# Patient Record
Sex: Male | Born: 1995 | Race: White | Hispanic: No | Marital: Single | Smoking: Current every day smoker
Health system: Southern US, Community
[De-identification: ages and names within clinical notes are randomized; demographics above are authoritative.]

## PROBLEM LIST (undated history)

## (undated) DIAGNOSIS — F329 Major depressive disorder, single episode, unspecified: Secondary | ICD-10-CM

## (undated) DIAGNOSIS — S5420XA Injury of radial nerve at forearm level, unspecified arm, initial encounter: Secondary | ICD-10-CM

## (undated) DIAGNOSIS — S40811A Abrasion of right upper arm, initial encounter: Secondary | ICD-10-CM

## (undated) DIAGNOSIS — F419 Anxiety disorder, unspecified: Secondary | ICD-10-CM

## (undated) DIAGNOSIS — Z87898 Personal history of other specified conditions: Secondary | ICD-10-CM

## (undated) DIAGNOSIS — F32A Depression, unspecified: Secondary | ICD-10-CM

## (undated) DIAGNOSIS — L409 Psoriasis, unspecified: Secondary | ICD-10-CM

## (undated) HISTORY — PX: ORIF ANKLE FRACTURE: SHX5408

## (undated) HISTORY — DX: Major depressive disorder, single episode, unspecified: F32.9

## (undated) HISTORY — DX: Depression, unspecified: F32.A

## (undated) HISTORY — DX: Anxiety disorder, unspecified: F41.9

---
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
## 2018-04-25 ENCOUNTER — Encounter (HOSPITAL_COMMUNITY)
Admission: EM | Disposition: A | Discharge status: Home / Self Care | Payer: Self-pay | Source: Home / Self Care | Attending: Vascular Surgery

## 2018-04-25 ENCOUNTER — Emergency Department (HOSPITAL_COMMUNITY): Payer: Self-pay | Admitting: Certified Registered Nurse Anesthetist

## 2018-04-25 ENCOUNTER — Inpatient Hospital Stay (HOSPITAL_COMMUNITY)
Admission: EM | Admit: 2018-04-25 | Discharge: 2018-04-26 | DRG: 580 | Disposition: A | Discharge status: Home / Self Care | Payer: Self-pay | Attending: Vascular Surgery | Admitting: Vascular Surgery

## 2018-04-25 ENCOUNTER — Encounter (HOSPITAL_COMMUNITY): Payer: Self-pay | Admitting: Emergency Medicine

## 2018-04-25 DIAGNOSIS — S51812A Laceration without foreign body of left forearm, initial encounter: Principal | ICD-10-CM | POA: Diagnosis present

## 2018-04-25 DIAGNOSIS — Z791 Long term (current) use of non-steroidal anti-inflammatories (NSAID): Secondary | ICD-10-CM

## 2018-04-25 DIAGNOSIS — S55112A Laceration of radial artery at forearm level, left arm, initial encounter: Secondary | ICD-10-CM | POA: Diagnosis present

## 2018-04-25 DIAGNOSIS — S5420XA Injury of radial nerve at forearm level, unspecified arm, initial encounter: Secondary | ICD-10-CM | POA: Diagnosis present

## 2018-04-25 DIAGNOSIS — S55109A Unspecified injury of radial artery at forearm level, unspecified arm, initial encounter: Secondary | ICD-10-CM | POA: Diagnosis present

## 2018-04-25 DIAGNOSIS — S55192A Other specified injury of radial artery at forearm level, left arm, initial encounter: Secondary | ICD-10-CM

## 2018-04-25 DIAGNOSIS — R58 Hemorrhage, not elsewhere classified: Secondary | ICD-10-CM

## 2018-04-25 DIAGNOSIS — S61512A Laceration without foreign body of left wrist, initial encounter: Secondary | ICD-10-CM | POA: Diagnosis present

## 2018-04-25 DIAGNOSIS — R55 Syncope and collapse: Secondary | ICD-10-CM

## 2018-04-25 DIAGNOSIS — S40811A Abrasion of right upper arm, initial encounter: Secondary | ICD-10-CM

## 2018-04-25 DIAGNOSIS — W19XXXA Unspecified fall, initial encounter: Secondary | ICD-10-CM | POA: Diagnosis present

## 2018-04-25 DIAGNOSIS — Z87898 Personal history of other specified conditions: Secondary | ICD-10-CM

## 2018-04-25 HISTORY — DX: Personal history of other specified conditions: Z87.898

## 2018-04-25 HISTORY — PX: ARTERY REPAIR: SHX559

## 2018-04-25 HISTORY — PX: ARTERY REPAIR: SHX5117

## 2018-04-25 HISTORY — DX: Abrasion of right upper arm, initial encounter: S40.811A

## 2018-04-25 LAB — CBC WITH DIFFERENTIAL/PLATELET
Abs Immature Granulocytes: 0.05 10*3/uL (ref 0.00–0.07)
Basophils Absolute: 0.1 10*3/uL (ref 0.0–0.1)
Basophils Relative: 1 %
EOS ABS: 0.3 10*3/uL (ref 0.0–0.5)
EOS PCT: 3 %
HEMATOCRIT: 40.7 % (ref 39.0–52.0)
HEMOGLOBIN: 13.9 g/dL (ref 13.0–17.0)
Immature Granulocytes: 1 %
LYMPHS ABS: 3.9 10*3/uL (ref 0.7–4.0)
LYMPHS PCT: 38 %
MCH: 31.2 pg (ref 26.0–34.0)
MCHC: 34.2 g/dL (ref 30.0–36.0)
MCV: 91.5 fL (ref 80.0–100.0)
MONO ABS: 0.9 10*3/uL (ref 0.1–1.0)
Monocytes Relative: 8 %
Neutro Abs: 5.1 10*3/uL (ref 1.7–7.7)
Neutrophils Relative %: 49 %
Platelets: 239 10*3/uL (ref 150–400)
RBC: 4.45 MIL/uL (ref 4.22–5.81)
RDW: 12.1 % (ref 11.5–15.5)
WBC: 10.3 10*3/uL (ref 4.0–10.5)
nRBC: 0 % (ref 0.0–0.2)

## 2018-04-25 LAB — I-STAT TROPONIN, ED: Troponin i, poc: 0 ng/mL (ref 0.00–0.08)

## 2018-04-25 LAB — COMPREHENSIVE METABOLIC PANEL
ALBUMIN: 3.8 g/dL (ref 3.5–5.0)
ALK PHOS: 70 U/L (ref 38–126)
ALT: 30 U/L (ref 0–44)
AST: 20 U/L (ref 15–41)
Anion gap: 9 (ref 5–15)
BILIRUBIN TOTAL: 0.7 mg/dL (ref 0.3–1.2)
BUN: 17 mg/dL (ref 6–20)
CALCIUM: 8.9 mg/dL (ref 8.9–10.3)
CO2: 22 mmol/L (ref 22–32)
CREATININE: 1.12 mg/dL (ref 0.61–1.24)
Chloride: 107 mmol/L (ref 98–111)
GFR calc Af Amer: >60 mL/min (ref >60)
GFR calc non Af Amer: >60 mL/min (ref >60)
Glucose, Bld: 172 mg/dL — ABNORMAL HIGH (ref 70–99)
Potassium: 3.8 mmol/L (ref 3.5–5.1)
Sodium: 138 mmol/L (ref 135–145)
TOTAL PROTEIN: 6.4 g/dL — AB (ref 6.5–8.1)

## 2018-04-25 LAB — ABO/RH: ABO/RH(D): A POS

## 2018-04-25 LAB — TYPE AND SCREEN
ABO/RH(D): A POS
Antibody Screen: NEGATIVE

## 2018-04-25 LAB — POCT I-STAT 4, (NA,K, GLUC, HGB,HCT)
Glucose, Bld: 130 mg/dL — ABNORMAL HIGH (ref 70–99)
HCT: 30 % — ABNORMAL LOW (ref 39.0–52.0)
Hemoglobin: 10.2 g/dL — ABNORMAL LOW (ref 13.0–17.0)
Potassium: 3.7 mmol/L (ref 3.5–5.1)
Sodium: 143 mmol/L (ref 135–145)

## 2018-04-25 SURGERY — REPAIR, ARTERY, BRACHIAL
Anesthesia: General | Site: Wrist | Laterality: Left

## 2018-04-25 MED ORDER — SODIUM CHLORIDE 0.9 % IV SOLN
INTRAVENOUS | Status: DC | PRN
  Administered 2018-04-25: 25 ug/min via INTRAVENOUS

## 2018-04-25 MED ORDER — MEPERIDINE HCL 50 MG/ML IJ SOLN: 6.2500 mg | INTRAMUSCULAR | Status: DC | PRN

## 2018-04-25 MED ORDER — GLYCOPYRROLATE 0.2 MG/ML IJ SOLN
INTRAMUSCULAR | Status: DC | PRN
  Administered 2018-04-25: 0.1 mg via INTRAVENOUS

## 2018-04-25 MED ORDER — POTASSIUM CHLORIDE CRYS ER 20 MEQ PO TBCR
20.0000 meq | EXTENDED_RELEASE_TABLET | Freq: Once | ORAL | Status: AC
  Administered 2018-04-25: 20 meq via ORAL
  Filled 2018-04-25: qty 1

## 2018-04-25 MED ORDER — EPHEDRINE 5 MG/ML INJ
INTRAVENOUS | Status: AC
  Filled 2018-04-25: qty 10

## 2018-04-25 MED ORDER — PHENYLEPHRINE 40 MCG/ML (10ML) SYRINGE FOR IV PUSH (FOR BLOOD PRESSURE SUPPORT)
PREFILLED_SYRINGE | INTRAVENOUS | Status: AC
  Filled 2018-04-25: qty 10

## 2018-04-25 MED ORDER — CEFAZOLIN SODIUM-DEXTROSE 2-3 GM-%(50ML) IV SOLR
INTRAVENOUS | Status: DC | PRN
  Administered 2018-04-25: 2 g via INTRAVENOUS

## 2018-04-25 MED ORDER — FENTANYL CITRATE (PF) 250 MCG/5ML IJ SOLN
INTRAMUSCULAR | Status: AC
  Filled 2018-04-25: qty 5

## 2018-04-25 MED ORDER — ACETAMINOPHEN 325 MG PO TABS: 325.0000 mg | ORAL_TABLET | ORAL | Status: DC | PRN

## 2018-04-25 MED ORDER — BACITRACIN-NEOMYCIN-POLYMYXIN 400-5-5000 EX OINT
TOPICAL_OINTMENT | CUTANEOUS | Status: AC
  Filled 2018-04-25: qty 1

## 2018-04-25 MED ORDER — SUGAMMADEX SODIUM 200 MG/2ML IV SOLN
INTRAVENOUS | Status: DC | PRN
  Administered 2018-04-25: 200 mg via INTRAVENOUS

## 2018-04-25 MED ORDER — EPHEDRINE SULFATE 50 MG/ML IJ SOLN
INTRAMUSCULAR | Status: DC | PRN
  Administered 2018-04-25: 5 mg via INTRAVENOUS

## 2018-04-25 MED ORDER — GUAIFENESIN-DM 100-10 MG/5ML PO SYRP: 15.0000 mL | ORAL_SOLUTION | ORAL | Status: DC | PRN

## 2018-04-25 MED ORDER — ONDANSETRON HCL 4 MG/2ML IJ SOLN: 4.0000 mg | Freq: Four times a day (QID) | INTRAMUSCULAR | Status: DC | PRN

## 2018-04-25 MED ORDER — HYDROMORPHONE HCL 1 MG/ML IJ SOLN
1.0000 mg | Freq: Once | INTRAMUSCULAR | Status: AC
  Administered 2018-04-25: 1 mg via INTRAVENOUS
  Filled 2018-04-25: qty 1

## 2018-04-25 MED ORDER — GLYCOPYRROLATE PF 0.2 MG/ML IJ SOSY
PREFILLED_SYRINGE | INTRAMUSCULAR | Status: AC
  Filled 2018-04-25: qty 1

## 2018-04-25 MED ORDER — MIDAZOLAM HCL 2 MG/2ML IJ SOLN
INTRAMUSCULAR | Status: DC | PRN
  Administered 2018-04-25: 2 mg via INTRAVENOUS

## 2018-04-25 MED ORDER — HEMOSTATIC AGENTS (NO CHARGE) OPTIME
TOPICAL | Status: DC | PRN
  Administered 2018-04-25: 1 via TOPICAL

## 2018-04-25 MED ORDER — PHENOL 1.4 % MT LIQD: 1.0000 | OROMUCOSAL | Status: DC | PRN

## 2018-04-25 MED ORDER — MIDAZOLAM HCL 2 MG/2ML IJ SOLN: 0.5000 mg | Freq: Once | INTRAMUSCULAR | Status: DC | PRN

## 2018-04-25 MED ORDER — HEPARIN SODIUM (PORCINE) 1000 UNIT/ML IJ SOLN
INTRAMUSCULAR | Status: AC
  Filled 2018-04-25: qty 2

## 2018-04-25 MED ORDER — PANTOPRAZOLE SODIUM 40 MG PO TBEC
40.0000 mg | DELAYED_RELEASE_TABLET | Freq: Every day | ORAL | Status: DC
  Administered 2018-04-25 – 2018-04-26 (×2): 40 mg via ORAL
  Filled 2018-04-25 (×2): qty 1

## 2018-04-25 MED ORDER — ALBUMIN HUMAN 5 % IV SOLN
INTRAVENOUS | Status: DC | PRN
  Administered 2018-04-25: 13:00:00 via INTRAVENOUS

## 2018-04-25 MED ORDER — ONDANSETRON HCL 4 MG/2ML IJ SOLN
4.0000 mg | Freq: Once | INTRAMUSCULAR | Status: AC
  Administered 2018-04-25: 4 mg via INTRAVENOUS
  Filled 2018-04-25: qty 2

## 2018-04-25 MED ORDER — SODIUM CHLORIDE 0.9 % IV SOLN
INTRAVENOUS | Status: DC | PRN
  Administered 2018-04-25: 500 mL

## 2018-04-25 MED ORDER — LABETALOL HCL 5 MG/ML IV SOLN: 10.0000 mg | INTRAVENOUS | Status: DC | PRN

## 2018-04-25 MED ORDER — LIDOCAINE 2% (20 MG/ML) 5 ML SYRINGE
INTRAMUSCULAR | Status: AC
  Filled 2018-04-25: qty 5

## 2018-04-25 MED ORDER — ONDANSETRON HCL 4 MG/2ML IJ SOLN
INTRAMUSCULAR | Status: DC | PRN
  Administered 2018-04-25: 4 mg via INTRAVENOUS

## 2018-04-25 MED ORDER — ROCURONIUM BROMIDE 50 MG/5ML IV SOSY
PREFILLED_SYRINGE | INTRAVENOUS | Status: AC
  Filled 2018-04-25: qty 5

## 2018-04-25 MED ORDER — ALUM & MAG HYDROXIDE-SIMETH 200-200-20 MG/5ML PO SUSP: 15.0000 mL | ORAL | Status: DC | PRN

## 2018-04-25 MED ORDER — 0.9 % SODIUM CHLORIDE (POUR BTL) OPTIME
TOPICAL | Status: DC | PRN
  Administered 2018-04-25: 1000 mL

## 2018-04-25 MED ORDER — FENTANYL CITRATE (PF) 100 MCG/2ML IJ SOLN
25.0000 ug | INTRAMUSCULAR | Status: DC | PRN
  Administered 2018-04-25 (×3): 50 ug via INTRAVENOUS

## 2018-04-25 MED ORDER — PROMETHAZINE HCL 25 MG/ML IJ SOLN: 6.2500 mg | INTRAMUSCULAR | Status: DC | PRN

## 2018-04-25 MED ORDER — BACITRACIN-NEOMYCIN-POLYMYXIN OINTMENT TUBE
TOPICAL_OINTMENT | CUTANEOUS | Status: DC | PRN
  Administered 2018-04-25: 1 via TOPICAL

## 2018-04-25 MED ORDER — PROPOFOL 1000 MG/100ML IV EMUL
INTRAVENOUS | Status: AC
  Filled 2018-04-25: qty 100

## 2018-04-25 MED ORDER — ONDANSETRON HCL 4 MG/2ML IJ SOLN
INTRAMUSCULAR | Status: AC
  Filled 2018-04-25: qty 2

## 2018-04-25 MED ORDER — FENTANYL CITRATE (PF) 100 MCG/2ML IJ SOLN
INTRAMUSCULAR | Status: AC
  Filled 2018-04-25: qty 2

## 2018-04-25 MED ORDER — SODIUM CHLORIDE 0.9 % IV SOLN
INTRAVENOUS | Status: AC
  Filled 2018-04-25: qty 1.2

## 2018-04-25 MED ORDER — CEFAZOLIN SODIUM-DEXTROSE 2-4 GM/100ML-% IV SOLN
2.0000 g | Freq: Three times a day (TID) | INTRAVENOUS | Status: DC
  Administered 2018-04-25 – 2018-04-26 (×2): 2 g via INTRAVENOUS
  Filled 2018-04-25 (×3): qty 100

## 2018-04-25 MED ORDER — PROPOFOL 10 MG/ML IV BOLUS
INTRAVENOUS | Status: DC | PRN
  Administered 2018-04-25: 150 mg via INTRAVENOUS

## 2018-04-25 MED ORDER — ACETAMINOPHEN 325 MG RE SUPP: 325.0000 mg | RECTAL | Status: DC | PRN

## 2018-04-25 MED ORDER — MIDAZOLAM HCL 2 MG/2ML IJ SOLN
INTRAMUSCULAR | Status: AC
  Filled 2018-04-25: qty 2

## 2018-04-25 MED ORDER — SODIUM CHLORIDE 0.9 % IV SOLN
INTRAVENOUS | Status: DC | PRN
  Administered 2018-04-25 (×2): via INTRAVENOUS

## 2018-04-25 MED ORDER — LIDOCAINE 2% (20 MG/ML) 5 ML SYRINGE
INTRAMUSCULAR | Status: DC | PRN
  Administered 2018-04-25: 20 mg via INTRAVENOUS

## 2018-04-25 MED ORDER — SODIUM CHLORIDE 0.9 % IV BOLUS
1000.0000 mL | Freq: Once | INTRAVENOUS | Status: AC
  Administered 2018-04-25: 1000 mL via INTRAVENOUS

## 2018-04-25 MED ORDER — ROCURONIUM BROMIDE 50 MG/5ML IV SOSY
PREFILLED_SYRINGE | INTRAVENOUS | Status: DC | PRN
  Administered 2018-04-25: 30 mg via INTRAVENOUS

## 2018-04-25 MED ORDER — SENNOSIDES-DOCUSATE SODIUM 8.6-50 MG PO TABS: 1.0000 | ORAL_TABLET | Freq: Every evening | ORAL | Status: DC | PRN

## 2018-04-25 MED ORDER — PHENYLEPHRINE HCL 10 MG/ML IJ SOLN
INTRAMUSCULAR | Status: DC | PRN
  Administered 2018-04-25: 120 ug via INTRAVENOUS
  Administered 2018-04-25: 80 ug via INTRAVENOUS
  Administered 2018-04-25: 120 ug via INTRAVENOUS

## 2018-04-25 MED ORDER — OXYCODONE-ACETAMINOPHEN 5-325 MG PO TABS
1.0000 | ORAL_TABLET | ORAL | Status: DC | PRN
  Administered 2018-04-25 – 2018-04-26 (×5): 2 via ORAL
  Filled 2018-04-25 (×6): qty 2

## 2018-04-25 MED ORDER — PROPOFOL 10 MG/ML IV BOLUS
INTRAVENOUS | Status: AC
  Filled 2018-04-25: qty 20

## 2018-04-25 MED ORDER — FENTANYL CITRATE (PF) 250 MCG/5ML IJ SOLN
INTRAMUSCULAR | Status: DC | PRN
  Administered 2018-04-25 (×5): 50 ug via INTRAVENOUS

## 2018-04-25 MED ORDER — METOPROLOL TARTRATE 5 MG/5ML IV SOLN: 2.0000 mg | INTRAVENOUS | Status: DC | PRN

## 2018-04-25 MED ORDER — BISACODYL 10 MG RE SUPP: 10.0000 mg | Freq: Every day | RECTAL | Status: DC | PRN

## 2018-04-25 MED ORDER — DEXAMETHASONE SODIUM PHOSPHATE 10 MG/ML IJ SOLN
INTRAMUSCULAR | Status: DC | PRN
  Administered 2018-04-25: 10 mg via INTRAVENOUS

## 2018-04-25 MED ORDER — SUCCINYLCHOLINE CHLORIDE 20 MG/ML IJ SOLN
INTRAMUSCULAR | Status: DC | PRN
  Administered 2018-04-25: 140 mg via INTRAVENOUS

## 2018-04-25 MED ORDER — HYDRALAZINE HCL 20 MG/ML IJ SOLN: 5.0000 mg | INTRAMUSCULAR | Status: DC | PRN

## 2018-04-25 MED ORDER — MORPHINE SULFATE (PF) 2 MG/ML IV SOLN
2.0000 mg | INTRAVENOUS | Status: DC | PRN
  Administered 2018-04-25 – 2018-04-26 (×6): 2 mg via INTRAVENOUS
  Filled 2018-04-25 (×6): qty 1

## 2018-04-25 MED ORDER — SUCCINYLCHOLINE CHLORIDE 200 MG/10ML IV SOSY
PREFILLED_SYRINGE | INTRAVENOUS | Status: AC
  Filled 2018-04-25: qty 10

## 2018-04-25 SURGICAL SUPPLY — 31 items
ARMBAND PINK RESTRICT EXTREMIT (MISCELLANEOUS) IMPLANT
CANISTER SUCT 3000ML PPV (MISCELLANEOUS) ×3 IMPLANT
CATH EMB 2FR 60CM (CATHETERS) ×3 IMPLANT
CLIP VESOCCLUDE MED 6/CT (CLIP) ×3 IMPLANT
CLIP VESOCCLUDE SM WIDE 6/CT (CLIP) ×3 IMPLANT
COVER PROBE W GEL 5X96 (DRAPES) ×3 IMPLANT
CUFF TOURNIQUET SINGLE 18IN (TOURNIQUET CUFF) ×3 IMPLANT
DERMABOND ADVANCED (GAUZE/BANDAGES/DRESSINGS) ×2
DERMABOND ADVANCED .7 DNX12 (GAUZE/BANDAGES/DRESSINGS) ×1 IMPLANT
DRSG MEPILEX BORDER 4X4 (GAUZE/BANDAGES/DRESSINGS) ×3 IMPLANT
ELECT REM PT RETURN 9FT ADLT (ELECTROSURGICAL) ×3
ELECTRODE REM PT RTRN 9FT ADLT (ELECTROSURGICAL) ×1 IMPLANT
GLOVE BIO SURGEON STRL SZ7.5 (GLOVE) ×3 IMPLANT
GOWN STRL REUS W/ TWL LRG LVL3 (GOWN DISPOSABLE) ×2 IMPLANT
GOWN STRL REUS W/ TWL XL LVL3 (GOWN DISPOSABLE) ×1 IMPLANT
GOWN STRL REUS W/TWL LRG LVL3 (GOWN DISPOSABLE) ×4
GOWN STRL REUS W/TWL XL LVL3 (GOWN DISPOSABLE) ×2
KIT BASIN OR (CUSTOM PROCEDURE TRAY) ×3 IMPLANT
KIT TURNOVER KIT B (KITS) ×3 IMPLANT
NS IRRIG 1000ML POUR BTL (IV SOLUTION) ×3 IMPLANT
PACK CV ACCESS (CUSTOM PROCEDURE TRAY) ×3 IMPLANT
PAD ARMBOARD 7.5X6 YLW CONV (MISCELLANEOUS) ×6 IMPLANT
STOPCOCK 4 WAY LG BORE MALE ST (IV SETS) ×3 IMPLANT
SURGICEL SNOW 2X4 (HEMOSTASIS) ×3 IMPLANT
SUT MNCRL AB 4-0 PS2 18 (SUTURE) ×3 IMPLANT
SUT PROLENE 6 0 BV (SUTURE) ×6 IMPLANT
SUT VIC AB 3-0 SH 27 (SUTURE) ×2
SUT VIC AB 3-0 SH 27X BRD (SUTURE) ×1 IMPLANT
TOWEL GREEN STERILE (TOWEL DISPOSABLE) ×3 IMPLANT
UNDERPAD 30X30 (UNDERPADS AND DIAPERS) ×3 IMPLANT
WATER STERILE IRR 1000ML POUR (IV SOLUTION) ×3 IMPLANT

## 2018-04-25 NOTE — ED Triage Notes (Signed)
Per EMS pt has recent history of seizures.  Today he was in the kitchen and had a possible syncopal event and fell on a plate and has large laceration on left posterior wrist.  Police applied tourniquet at 1005.  EMS does not know if is arterial.  Pt AOx4

## 2018-04-25 NOTE — H&P (Signed)
Hospital Consult    Reason for Consult: Bleeding left arm Referring Physician: Dr. Rush Landmarkegeler (ED) MRN #:  [id number]  History of Present Illness: This is a 22 y.o. male presented after syncopal episode with associated fall and injury to left wrist with plate glass window.  Does not take any blood thinners.  Tourniquet has been in place for 1 hour.  Patient remembers tetanus shot 2 years ago with ankle injury.  Past Medical History:  Diagnosis Date  . Anxiety   . Depression   . Headache(784.0)   . Syncope and collapse     Past Surgical History:  Procedure Laterality Date  . CIRCUMCISION  1999    No Known Allergies  Prior to Admission medications   Medication Sig Start Date End Date Taking? Authorizing Provider  ibuprofen (ADVIL,MOTRIN) 600 MG tablet Take 1 tablet (600 mg total) by mouth every 8 (eight) hours as needed. 02/01/16   Azalia Bilisampos, Kevin, MD    Social History   Socioeconomic History  . Marital status: Single    Spouse name: Not on file  . Number of children: Not on file  . Years of education: 7  . Highest education level: Not on file  Occupational History    Employer: UNEMPLOYED  Social Needs  . Financial resource strain: Not on file  . Food insecurity:    Worry: Not on file    Inability: Not on file  . Transportation needs:    Medical: Not on file    Non-medical: Not on file  Tobacco Use  . Smoking status: Never Smoker  . Smokeless tobacco: Never Used  Substance and Sexual Activity  . Alcohol use: No  . Drug use: No  . Sexual activity: Never  Lifestyle  . Physical activity:    Days per week: Not on file    Minutes per session: Not on file  . Stress: Not on file  Relationships  . Social connections:    Talks on phone: Not on file    Gets together: Not on file    Attends religious service: Not on file    Active member of club or organization: Not on file    Attends meetings of clubs or organizations: Not on file    Relationship status: Not on file    . Intimate partner violence:    Fear of current or ex partner: Not on file    Emotionally abused: Not on file    Physically abused: Not on file    Forced sexual activity: Not on file  Other Topics Concern  . Not on file  Social History Narrative  . Not on file    Family History  Problem Relation Age of Onset  . Sudden death Paternal Grandfather        At age 20  . Headache Father   . Sudden death Other        At age 22  . Sudden death Paternal Uncle        At age 22    ROS: Right arm pain  Physical Examination  There were no vitals filed for this visit. Body mass index is 20.54 kg/m.  General:  WDWN in NAD Gait: Not observed HENT: WNL, normocephalic Extremities there is a 3 cm laceration to the volar left wrist with exposed muscle Musculoskeletal: no muscle wasting or atrophy  tourniquet is in place above the left elbow Neurologic: He cannot make a grip of the left hand   CBC    Component Value  Date/Time   WBC 6.0 02/13/2015 1852   RBC 4.52 02/13/2015 1852   HGB 14.1 02/13/2015 1852   HCT 41.1 02/13/2015 1852   PLT 179 02/13/2015 1852   MCV 90.9 02/13/2015 1852   MCH 31.2 02/13/2015 1852   MCHC 34.3 02/13/2015 1852   RDW 12.4 02/13/2015 1852    BMET    Component Value Date/Time   NA 139 02/13/2015 1852   K 4.0 02/13/2015 1852   CL 106 02/13/2015 1852   CO2 26 02/13/2015 1852   GLUCOSE 96 02/13/2015 1852   BUN 17 02/13/2015 1852   CREATININE 1.16 (H) 02/13/2015 1852   CALCIUM 9.2 02/13/2015 1852   GFRNONAA NOT CALCULATED 02/13/2015 1852   GFRAA NOT CALCULATED 02/13/2015 1852    COAGS: No results found for: INR, PROTIME   Non-Invasive Vascular Imaging:   No imaging   ASSESSMENT/PLAN: This is a 22 y.o. male with plate glass injury to left wrist with likely arterial and venous injury in the area of the left radial neurovascular bundle.  We will plan to take the operating room urgently for exploration and possible vascular repair.  I discussed  risk and benefits with him and possible need for hand surgery involvement.  I have also discussed with his mother and sister via telephone who are on their way.  Shaheen Star C. Randie Heinzain, MD [facility] [place] Office: [phone] Pager: [phone]

## 2018-04-25 NOTE — Anesthesia Procedure Notes (Signed)
Procedure Name: Intubation Date/Time: 04/25/2018 11:56 AM Performed by: Inda Coke, CRNA Pre-anesthesia Checklist: Patient identified, Emergency Drugs available, Suction available and Patient being monitored Patient Re-evaluated:Patient Re-evaluated prior to induction Oxygen Delivery Method: Circle System Utilized Preoxygenation: Pre-oxygenation with 100% oxygen Induction Type: IV induction, Rapid sequence and Cricoid Pressure applied Ventilation: Mask ventilation without difficulty Laryngoscope Size: Mac and 4 Grade View: Grade I Tube type: Oral Tube size: 7.5 mm Number of attempts: 1 Airway Equipment and Method: Stylet and Oral airway Placement Confirmation: ETT inserted through vocal cords under direct vision,  positive ETCO2 and breath sounds checked- equal and bilateral Secured at: 22 cm Tube secured with: Tape Dental Injury: Teeth and Oropharynx as per pre-operative assessment

## 2018-04-25 NOTE — Progress Notes (Signed)
Patient received from PACU, A&Ox4, VSS. Telemetry applied and CCMD notified. 

## 2018-04-25 NOTE — Consult Note (Signed)
ORTHOPAEDIC CONSULTATION HISTORY & PHYSICAL REQUESTING PHYSICIAN: Juventino SlovakCain, Brandon Christophe*  Chief Complaint: left wrist injury  HPI: Moreen Fowlerracy S Wieland is a 22 y.o. male who fell onto a plate, laceration the radial aspect of his left distal forearm.  Concern existed for arterial injury, and he was transported via EMS to the emergency department, taken to the operating room by Dr. Pascal LuxKane, and his radial artery repaired.  In the course of exploration, Dr. Randie Heinzain observed what appeared to be a cutaneous nerve lacerated, and I was consulted intraoperatively.  I was unavailable to observe the wound at that time, but have since evaluated the patient in the PACU.  He is awake, conversant, and does fairly heavy work with his hands.  He has his family's primary income producer, with a baby at home.  Past Medical History:  Diagnosis Date  . Anxiety   . Depression   . Headache(784.0)   . Syncope and collapse    Past Surgical History:  Procedure Laterality Date  . CIRCUMCISION  1999   Social History   Socioeconomic History  . Marital status: Single    Spouse name: Not on file  . Number of children: Not on file  . Years of education: 7  . Highest education level: Not on file  Occupational History    Employer: UNEMPLOYED  Social Needs  . Financial resource strain: Not on file  . Food insecurity:    Worry: Not on file    Inability: Not on file  . Transportation needs:    Medical: Not on file    Non-medical: Not on file  Tobacco Use  . Smoking status: Never Smoker  . Smokeless tobacco: Never Used  Substance and Sexual Activity  . Alcohol use: No  . Drug use: No  . Sexual activity: Never  Lifestyle  . Physical activity:    Days per week: Not on file    Minutes per session: Not on file  . Stress: Not on file  Relationships  . Social connections:    Talks on phone: Not on file    Gets together: Not on file    Attends religious service: Not on file    Active member of club or  organization: Not on file    Attends meetings of clubs or organizations: Not on file    Relationship status: Not on file  Other Topics Concern  . Not on file  Social History Narrative  . Not on file   Family History  Problem Relation Age of Onset  . Sudden death Paternal Grandfather        At age 20  . Headache Father   . Sudden death Other        At age 20  . Sudden death Paternal Uncle        At age 20   No Known Allergies Prior to Admission medications   Medication Sig Start Date End Date Taking? Authorizing Provider  ibuprofen (ADVIL,MOTRIN) 600 MG tablet Take 1 tablet (600 mg total) by mouth every 8 (eight) hours as needed. 02/01/16   Azalia Bilisampos, Kevin, MD   No results found.  Positive ROS: All other systems have been reviewed and were otherwise negative with the exception of those mentioned in the HPI and as above.  Physical Exam: Vitals: Refer to EMR. Constitutional:  WD, WN, NAD HEENT:  NCAT, EOMI Neuro/Psych:  Alert & oriented to person, place, and time; appropriate mood & affect Lymphatic: No generalized extremity edema or lymphadenopathy Extremities / MSK:  The extremities are normal with respect to appearance, ranges of motion, joint stability, muscle strength/tone, sensation, & perfusion except as otherwise noted:  There is a Mepilex dressing on the radial aspect of the left distal forearm.  With percussing of the dressing, there is a Tinel sign, and there is markedly altered sensibility in the region of the superficial radial nerve.  Digital motion is full.  Assessment: Likely left superficial radial nerve laceration  Plan: I discussed these findings with him and his mother at the bedside.  I reviewed what he might expect both with and without operative exploration and repair.  After careful consideration and deliberation, he indicated he would like to proceed with surgical exploration and likely repair.  This is tentatively planned for Monday, 05-01-18, at [facility].  Goals, risk, and options were reviewed and consent obtained.  I have confirmed with the staff at [facility] that the case has indeed been posted and placed officially on the schedule.  Maija Biggers A. Greg Eckrich, MD      Orthopaedic & Hand Surgery [facility] [address] , [place]  [address] Office: [phone] Mobile: [phone]  04/25/2018, 3:28 PM  

## 2018-04-25 NOTE — Anesthesia Postprocedure Evaluation (Signed)
Anesthesia Post Note  Patient: Antonio Lewis  Procedure(s) Performed: LEFT WRIST EXPLORATION (Left Wrist) RADIAL ARTERY REPAIR (Left Wrist)     Patient location during evaluation: PACU Anesthesia Type: General Level of consciousness: awake and alert, patient cooperative and oriented Pain management: pain level controlled Vital Signs Assessment: post-procedure vital signs reviewed and stable Respiratory status: spontaneous breathing, nonlabored ventilation and respiratory function stable Cardiovascular status: blood pressure returned to baseline and stable Postop Assessment: no apparent nausea or vomiting Anesthetic complications: no    Last Vitals:  Vitals:   04/25/18 1130 04/25/18 1315  BP: (!) 142/62   Pulse: (!) 56   Resp: 14   Temp:  36.7 C  SpO2: 100%     Last Pain:  Vitals:   04/25/18 1355  TempSrc:   PainSc: Asleep                 Charlie Seda,E. Ai Sonnenfeld

## 2018-04-25 NOTE — H&P (View-Only) (Signed)
ORTHOPAEDIC CONSULTATION HISTORY & PHYSICAL REQUESTING PHYSICIAN: Juventino SlovakCain, Brandon Christophe*  Chief Complaint: left wrist injury  HPI: Antonio Lewis is a 22 y.o. male who fell onto a plate, laceration the radial aspect of his left distal forearm.  Concern existed for arterial injury, and he was transported via EMS to the emergency department, taken to the operating room by Dr. Pascal LuxKane, and his radial artery repaired.  In the course of exploration, Dr. Randie Heinzain observed what appeared to be a cutaneous nerve lacerated, and I was consulted intraoperatively.  I was unavailable to observe the wound at that time, but have since evaluated the patient in the PACU.  He is awake, conversant, and does fairly heavy work with his hands.  He has his family's primary income producer, with a baby at home.  Past Medical History:  Diagnosis Date  . Anxiety   . Depression   . Headache(784.0)   . Syncope and collapse    Past Surgical History:  Procedure Laterality Date  . CIRCUMCISION  1999   Social History   Socioeconomic History  . Marital status: Single    Spouse name: Not on file  . Number of children: Not on file  . Years of education: 7  . Highest education level: Not on file  Occupational History    Employer: UNEMPLOYED  Social Needs  . Financial resource strain: Not on file  . Food insecurity:    Worry: Not on file    Inability: Not on file  . Transportation needs:    Medical: Not on file    Non-medical: Not on file  Tobacco Use  . Smoking status: Never Smoker  . Smokeless tobacco: Never Used  Substance and Sexual Activity  . Alcohol use: No  . Drug use: No  . Sexual activity: Never  Lifestyle  . Physical activity:    Days per week: Not on file    Minutes per session: Not on file  . Stress: Not on file  Relationships  . Social connections:    Talks on phone: Not on file    Gets together: Not on file    Attends religious service: Not on file    Active member of club or  organization: Not on file    Attends meetings of clubs or organizations: Not on file    Relationship status: Not on file  Other Topics Concern  . Not on file  Social History Narrative  . Not on file   Family History  Problem Relation Age of Onset  . Sudden death Paternal Grandfather        At age 20  . Headache Father   . Sudden death Other        At age 20  . Sudden death Paternal Uncle        At age 20   No Known Allergies Prior to Admission medications   Medication Sig Start Date End Date Taking? Authorizing Provider  ibuprofen (ADVIL,MOTRIN) 600 MG tablet Take 1 tablet (600 mg total) by mouth every 8 (eight) hours as needed. 02/01/16   Azalia Bilisampos, Kevin, MD   No results found.  Positive ROS: All other systems have been reviewed and were otherwise negative with the exception of those mentioned in the HPI and as above.  Physical Exam: Vitals: Refer to EMR. Constitutional:  WD, WN, NAD HEENT:  NCAT, EOMI Neuro/Psych:  Alert & oriented to person, place, and time; appropriate mood & affect Lymphatic: No generalized extremity edema or lymphadenopathy Extremities / MSK:  The extremities are normal with respect to appearance, ranges of motion, joint stability, muscle strength/tone, sensation, & perfusion except as otherwise noted:  There is a Mepilex dressing on the radial aspect of the left distal forearm.  With percussing of the dressing, there is a Tinel sign, and there is markedly altered sensibility in the region of the superficial radial nerve.  Digital motion is full.  Assessment: Likely left superficial radial nerve laceration  Plan: I discussed these findings with him and his mother at the bedside.  I reviewed what he might expect both with and without operative exploration and repair.  After careful consideration and deliberation, he indicated he would like to proceed with surgical exploration and likely repair.  This is tentatively planned for [facility], 05[facility]01-18, [facility].  Goals, risk, and options were reviewed and consent obtained.  I have confirmed with the staff at [facility] that the case has indeed been posted and placed officially on the schedule.  Cliffton Astersavid A. Janee Mornhompson, MD      Orthopaedic & Hand Surgery [facility] [address] [place], [place]  [id number] Office: [phone] Mobile: [phone]  04/25/2018, 3:28 PM

## 2018-04-25 NOTE — Progress Notes (Signed)
Necklace placed in denture cup and taken to PACU

## 2018-04-25 NOTE — Transfer of Care (Signed)
Immediate Anesthesia Transfer of Care Note  Patient: Antonio Lewis  Procedure(s) Performed: LEFT WRIST EXPLORATION (Left Wrist) RADIAL ARTERY REPAIR (Left Wrist)  Patient Location: PACU  Anesthesia Type:General  Level of Consciousness: awake, patient cooperative and responds to stimulation  Airway & Oxygen Therapy: Patient Spontanous Breathing and Patient connected to nasal cannula oxygen  Post-op Assessment: Report given to RN and Post -op Vital signs reviewed and stable  Post vital signs: Reviewed and stable  Last Vitals:  Vitals Value Taken Time  BP 93/69 04/25/2018  1:12 PM  Temp    Pulse 86 04/25/2018  1:19 PM  Resp 22 04/25/2018  1:19 PM  SpO2 100 % 04/25/2018  1:19 PM  Vitals shown include unvalidated device data.  Last Pain:  Vitals:   04/25/18 1117  TempSrc: Oral  PainSc:          Complications: No apparent anesthesia complications

## 2018-04-25 NOTE — Anesthesia Preprocedure Evaluation (Addendum)
Anesthesia Evaluation  Patient identified by MRN, date of birth, ID band Patient awake    Reviewed: Allergy & Precautions, NPO status , Patient's Chart, lab work & pertinent test results  History of Anesthesia Complications Negative for: history of anesthetic complications  Airway Mallampati: II  TM Distance: >3 FB Neck ROM: Full    Dental  (+) Dental Advisory Given   Pulmonary neg pulmonary ROS,    breath sounds clear to auscultation       Cardiovascular negative cardio ROS   Rhythm:Regular Rate:Normal     Neuro/Psych  Headaches, Seizures -,  Anxiety Depression    GI/Hepatic negative GI ROS, Neg liver ROS,   Endo/Other  negative endocrine ROS  Renal/GU negative Renal ROS     Musculoskeletal   Abdominal   Peds  Hematology negative hematology ROS (+)   Anesthesia Other Findings Possible seizure today, fell and lacerated wrist  Reproductive/Obstetrics                            Anesthesia Physical Anesthesia Plan  ASA: II and emergent  Anesthesia Plan: General   Post-op Pain Management:    Induction: Intravenous and Rapid sequence  PONV Risk Score and Plan: 2 and Ondansetron and Dexamethasone  Airway Management Planned: Oral ETT  Additional Equipment:   Intra-op Plan:   Post-operative Plan: Extubation in OR  Informed Consent: I have reviewed the patients History and Physical, chart, labs and discussed the procedure including the risks, benefits and alternatives for the proposed anesthesia with the patient or authorized representative who has indicated his/her understanding and acceptance.   Dental advisory given  Plan Discussed with: CRNA and Surgeon  Anesthesia Plan Comments: (Plan routine monitors, GETA)       Anesthesia Quick Evaluation

## 2018-04-25 NOTE — Op Note (Signed)
    Patient name: Antonio Lewis MRN: [id number] DOB: [birth year] Sex: male  04/25/2018 Pre-operative Diagnosis: Injury left radial artery  post-operative diagnosis:  Same Surgeon:  Apolinar JunesBrandon C. Randie Heinzain, MD Assistant: Doreatha MassedSamantha Rhyne, PA Procedure Performed: 1.  Thrombectomy left radial artery 2.  Primary repair left radial artery transection  Indications: 22 year old male sustained a laceration to his left wrist which had pulsatile bleeding and tourniquet was placed 1 hour prior to arrival.  He was indicated for urgent exploration of the wound with possible arterial and venous repair.  No preoperative neuro study could be obtained given pain secondary to tourniquet placement.  Findings: The radial artery itself was 50% transected and was primarily repaired.  There was acute thrombus distally that was removed with embolectomy.  At completion there was a palpable radial pulse distally to this.  There was a strong ulnar pulse after the tourniquet was taken down and there was a palmar arch signal at completion.   Procedure:  The patient was identified in the holding area and taken t the operating room was placed supine operative table general anesthesia was induced.  He was sterilely prepped and draped in left upper extremity left groin for possible saphenous vein harvest antibiotics were administered timeout was called.  We began with exploring the wound were identified 2 ends of the radial artery and clamped these.  Tourniquet was then allowed down.  There was some bleeding from concomitant vein this was tied off with silk suture.  Identified a transected nerve and consulted hand surgery they will evaluate later.  There was good antegrade bleeding but there was no backbleeding and so thrombectomy was performed there was some acute clot removed and backbleeding then occurred.  The artery was reclamped.  The artery was mobilized a few cm in either direction and then primarily repaired with running 6-0 Prolene  suture.  We then had a good signal at the palmar arch ulnar artery and then later in the case there was a palpable radial artery.  I discussed the case with the on-call hand surgeon he will evaluate the patient later.  We irrigated the wound copiously and closed the skin with interrupted 3-0 nylon suture and a sterile dressing was placed.  He tolerated this procedure without immediate complication.  EBL: 50 cc   [name], MD [facility] [place] Office: [phone] Pager: [phone]

## 2018-04-25 NOTE — ED Provider Notes (Signed)
[facility] EMERGENCY DEPARTMENT Provider Note   CSN: [id number] Arrival date & time: 04/25/18  1051     History   Chief Complaint Chief Complaint  Patient presents with  . Extremity Laceration    HPI Antonio Lewis is a 22 y.o. male.  The history is provided by the patient, the EMS personnel and medical records. No language interpreter was used.  Laceration   The incident occurred 1 to 2 hours ago. The laceration is located on the left arm. The laceration is 3 cm in size. Injury mechanism: broken ceramic plate. The pain is at a severity of 10/10. The pain is severe. The pain has been constant since onset. His tetanus status is unknown.  Loss of Consciousness   This is a recurrent problem. The current episode started 1 to 2 hours ago. The problem occurs rarely. The problem has been resolved. There was no loss of consciousness. Associated symptoms include light-headedness (near syncopal episiode) and weakness. Pertinent negatives include abdominal pain, back pain, chest pain, confusion, congestion, diaphoresis, fever, malaise/fatigue, nausea, palpitations and vomiting. He has tried nothing for the symptoms.    Past Medical History:  Diagnosis Date  . Anxiety   . Depression   . Headache(784.0)   . Syncope and collapse     Patient Active Problem List   Diagnosis Date Noted  . 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 Dizziness and giddiness 08/09/2012  . Depression with anxiety 08/09/2012  . Knee pain 04/14/2011  . Anterior knee pain 03/30/2011    Past Surgical History:  Procedure Laterality Date  . CIRCUMCISION  1999        Home Medications    Prior to Admission medications   Medication Sig Start Date End Date Taking? Authorizing Provider  ibuprofen (ADVIL,MOTRIN) 600 MG tablet Take 1 tablet (600 mg total) by mouth every 8 (eight) hours as needed. 02/01/16   Azalia Bilis, MD    Family History Family History  Problem  Relation Age of Onset  . Sudden death Paternal Grandfather        At age 6  . Headache Father   . Sudden death Other        At age 29  . Sudden death Paternal Uncle        At age 23    Social History Social History   Tobacco Use  . Smoking status: Never Smoker  . Smokeless tobacco: Never Used  Substance Use Topics  . Alcohol use: No  . Drug use: No     Allergies   Patient has no known allergies.   Review of Systems Review of Systems  Constitutional: Negative for chills, diaphoresis, fatigue, fever and malaise/fatigue.  HENT: Negative for congestion.   Eyes: Negative for visual disturbance.  Respiratory: Negative for cough, chest tightness, shortness of breath and wheezing.   Cardiovascular: Positive for syncope. Negative for chest pain, palpitations and leg swelling.  Gastrointestinal: Negative for abdominal pain, constipation, diarrhea, nausea and vomiting.  Genitourinary: Negative for flank pain.  Musculoskeletal: Negative for back pain, neck pain and neck stiffness.  Skin: Positive for wound. Negative for rash.  Neurological: Positive for weakness, light-headedness (near syncopal episiode) and numbness. Negative for syncope (near syncopal) and facial asymmetry.  Psychiatric/Behavioral: Negative for agitation and confusion.  All other systems reviewed and are negative.    Physical Exam Updated Vital Signs BP (!) 125/48   Pulse (!) 56   Temp 98.1 F (36.7 C)  Resp 14   Ht 6\' 2"  (1.88 m)   Wt 72.6 kg   SpO2 99%   BMI 20.54 kg/m   Physical Exam Vitals signs and nursing note reviewed.  Constitutional:      General: He is not in acute distress.    Appearance: He is well-developed. He is not ill-appearing, toxic-appearing or diaphoretic.  HENT:     Head: Normocephalic and atraumatic.  Eyes:     Conjunctiva/sclera: Conjunctivae normal.  Neck:     Musculoskeletal: Neck supple.  Cardiovascular:     Rate and Rhythm: Normal rate and regular rhythm.      Heart sounds: No murmur.  Pulmonary:     Effort: Pulmonary effort is normal. No respiratory distress.     Breath sounds: Normal breath sounds. No wheezing or rhonchi.  Chest:     Chest wall: No tenderness.  Abdominal:     Palpations: Abdomen is soft.     Tenderness: There is no abdominal tenderness. There is no guarding.  Musculoskeletal:        General: Tenderness and signs of injury present.     Left forearm: He exhibits laceration.       Arms:     Comments: After tourniquet removal, patient has pulsatile bleeding of the left forearm laceration.  Patient has decreased sensation in the left hand.  Decreased grip strength due to pain.  No other injury seen.  Skin:    Capillary Refill: Capillary refill takes less than 2 seconds.     Coloration: Skin is pale.  Neurological:     Mental Status: He is alert.     Sensory: Sensory deficit present.     Motor: Weakness present.  Psychiatric:        Mood and Affect: Mood normal.      ED Treatments / Results  Labs (all labs ordered are listed, but only abnormal results are displayed) Labs Reviewed  COMPREHENSIVE METABOLIC PANEL - Abnormal; Notable for the following components:      Result Value   Glucose, Bld 172 (*)    Total Protein 6.4 (*)    All other components within normal limits  POCT I-STAT 4, (NA,K, GLUC, HGB,HCT) - Abnormal; Notable for the following components:   Glucose, Bld 130 (*)    HCT 30.0 (*)    Hemoglobin 10.2 (*)    All other components within normal limits  CBC WITH DIFFERENTIAL/PLATELET  I-STAT TROPONIN, ED  TYPE AND SCREEN  ABO/RH    EKG EKG Interpretation  Date/Time:  Tuesday April 25 2018 11:17:58 EST Ventricular Rate:  51 PR Interval:    QRS Duration: 109 QT Interval:  445 QTC Calculation: 410 R Axis:   66 Text Interpretation:  Sinus rhythm RSR' in V1 or V2, probably normal variant ST elevation suggests acute pericarditis when compared to prior, no significant changes from prior.  No STEMI  Confirmed by Theda Belfastegeler, Chris ([id number]) on 04/25/2018 11:24:35 AM   Radiology No results found.  Procedures Procedures (including critical care time)  CRITICAL CARE Performed by: Canary Brimhristopher J Tegeler Total critical care time: 35 minutes Critical care time was exclusive of separately billable procedures and treating other patients. Laceration with arterial bleeding using a tourniquet, went straight to operating room with vascular surgery, threatened to limb. Critical care was necessary to treat or prevent imminent or life-threatening deterioration. Critical care was time spent personally by me on the following activities: development of treatment plan with patient and/or surrogate as well as nursing, discussions with consultants,  evaluation of patient's response to treatment, examination of patient, obtaining history from patient or surrogate, ordering and performing treatments and interventions, ordering and review of laboratory studies, ordering and review of radiographic studies, pulse oximetry and re-evaluation of patient's condition.   Medications Ordered in ED Medications  meperidine (DEMEROL) injection 6.25-12.5 mg (has no administration in time range)  midazolam (VERSED) injection 0.5-2 mg (has no administration in time range)  promethazine (PHENERGAN) injection 6.25-12.5 mg (has no administration in time range)  fentaNYL (SUBLIMAZE) injection 25-50 mcg (50 mcg Intravenous Given 04/25/18 1513)  fentaNYL (SUBLIMAZE) 100 MCG/2ML injection (has no administration in time range)  sodium chloride 0.9 % bolus 1,000 mL ( Intravenous Canceled Entry 04/25/18 1151)  HYDROmorphone (DILAUDID) injection 1 mg (1 mg Intravenous Given 04/25/18 1125)  ondansetron (ZOFRAN) injection 4 mg (4 mg Intravenous Given 04/25/18 1124)     Initial Impression / Assessment and Plan / ED Course  I have reviewed the triage vital signs and the nursing notes.  Pertinent labs & imaging results that were available  during my care of the patient were reviewed by me and considered in my medical decision making (see chart for details).     Moreen Fowlerracy S Ascher is a 22 y.o. male with a past medical history significant for anxiety, depression, and prior syncopal episodes who presents with near syncopal episode and left arm laceration.  According to EMS report, patient was at home in the kitchen when he had a near syncopal episode event and fell on a ceramic plate causing a laceration to his left forearm.  He reports a large amount of bleeding occurred.  Law enforcement placed a tourniquet at 10:05 AM, proximally 1 hour prior to my initial evaluation.  On my initial evaluation, patient had the laceration wrapped and had a tourniquet in place.  Tourniquet was removed at 11:08 AM.  Pulsatile bleeding started.  Patient had numbness in his hand.  Decreased grip strength due to pain.  Tourniquet was reapplied slightly more proximally.   No other injury seen on initial exam.  Lungs clear and chest was nontender.  Abdomen was nontender.  Vascular surgery was quickly called.  Patient had screening lab work and EKG.  EKG appears similar to prior.  No STEMI.  No significant arrhythmia seen.  Slight bradycardia.  Vascular surgery will take patient to operating room.  Anticipate further management for near syncopal and syncopal events after laceration is managed.   Final Clinical Impressions(s) / ED Diagnoses   Final diagnoses:  Forearm laceration, left, initial encounter  Laceration of left radial artery, initial encounter  Near syncope  Arterial hemorrhage    Clinical Impression: 1. Forearm laceration, left, initial encounter   2. Laceration of left radial artery, initial encounter   3. Near syncope   4. Arterial hemorrhage     Disposition: Admit  This note was prepared with assistance of Dragon voice recognition software. Occasional wrong-word or sound-a-like substitutions may have occurred due to the inherent  limitations of voice recognition software.      Tegeler, Canary Brimhristopher J, MD 04/25/18 [phone]

## 2018-04-25 NOTE — ED Notes (Signed)
Paged vas. Surg.

## 2018-04-26 ENCOUNTER — Encounter (HOSPITAL_COMMUNITY): Payer: Self-pay | Admitting: Vascular Surgery

## 2018-04-26 LAB — CBC
HCT: 31 % — ABNORMAL LOW (ref 39.0–52.0)
Hemoglobin: 10.7 g/dL — ABNORMAL LOW (ref 13.0–17.0)
MCH: 30.6 pg (ref 26.0–34.0)
MCHC: 34.5 g/dL (ref 30.0–36.0)
MCV: 88.6 fL (ref 80.0–100.0)
Platelets: 193 10*3/uL (ref 150–400)
RBC: 3.5 MIL/uL — ABNORMAL LOW (ref 4.22–5.81)
RDW: 12 % (ref 11.5–15.5)
WBC: 13 10*3/uL — ABNORMAL HIGH (ref 4.0–10.5)
nRBC: 0 % (ref 0.0–0.2)

## 2018-04-26 LAB — BASIC METABOLIC PANEL
ANION GAP: 8 (ref 5–15)
BUN: 13 mg/dL (ref 6–20)
CALCIUM: 8.7 mg/dL — AB (ref 8.9–10.3)
CO2: 25 mmol/L (ref 22–32)
Chloride: 104 mmol/L (ref 98–111)
Creatinine, Ser: 1.09 mg/dL (ref 0.61–1.24)
GFR calc Af Amer: >60 mL/min (ref >60)
Glucose, Bld: 146 mg/dL — ABNORMAL HIGH (ref 70–99)
POTASSIUM: 4.1 mmol/L (ref 3.5–5.1)
Sodium: 137 mmol/L (ref 135–145)

## 2018-04-26 LAB — HIV ANTIBODY (ROUTINE TESTING W REFLEX): HIV Screen 4th Generation wRfx: NONREACTIVE

## 2018-04-26 MED ORDER — CEPHALEXIN 500 MG PO CAPS: 500.0000 mg | ORAL_CAPSULE | Freq: Two times a day (BID) | ORAL | 0 refills | Status: DC

## 2018-04-26 MED ORDER — ASPIRIN EC 81 MG PO TBEC: 81.0000 mg | DELAYED_RELEASE_TABLET | Freq: Every day | ORAL | Status: DC

## 2018-04-26 MED ORDER — OXYCODONE-ACETAMINOPHEN 5-325 MG PO TABS: 1.0000 | ORAL_TABLET | Freq: Four times a day (QID) | ORAL | 0 refills | Status: DC | PRN

## 2018-04-26 NOTE — Progress Notes (Addendum)
  Progress Note    04/26/2018 8:22 AM 1 Day Post-Op  Subjective:  C/o numbness in his thumb and 1st finger into his wrist.   Otherwise doing well.  Afebrile HR 50's-70's  110's-120's systolic 98% RA  Vitals:   04/26/18 0100 04/26/18 0431  BP: (!) 129/55 (!) 111/41  Pulse:  66  Resp: 14 17  Temp: 98.8 F (37.1 C) 97.7 F (36.5 C)  SpO2: 96% 98%    Physical Exam: Cardiac:  regular Lungs:  Non labored Incisions:  Clean and dry with sutures in tact.  Extremities:  Easily palpable left radial pulse; good left hand grip   CBC    Component Value Date/Time   WBC 13.0 (H) 04/26/2018 0312   RBC 3.50 (L) 04/26/2018 0312   HGB 10.7 (L) 04/26/2018 0312   HCT 31.0 (L) 04/26/2018 0312   PLT 193 04/26/2018 0312   MCV 88.6 04/26/2018 0312   MCH 30.6 04/26/2018 0312   MCHC 34.5 04/26/2018 0312   RDW 12.0 04/26/2018 0312   LYMPHSABS 3.9 04/25/2018 1119   MONOABS 0.9 04/25/2018 1119   EOSABS 0.3 04/25/2018 1119   BASOSABS 0.1 04/25/2018 1119    BMET    Component Value Date/Time   NA 137 04/26/2018 0312   K 4.1 04/26/2018 0312   CL 104 04/26/2018 0312   CO2 25 04/26/2018 0312   GLUCOSE 146 (H) 04/26/2018 0312   BUN 13 04/26/2018 0312   CREATININE 1.09 04/26/2018 0312   CALCIUM 8.7 (L) 04/26/2018 0312   GFRNONAA >60 04/26/2018 0312   GFRAA >60 04/26/2018 0312    INR No results found for: INR   Intake/Output Summary (Last 24 hours) at 04/26/2018 9326 Last data filed at 04/25/2018 1600 Gross per 24 hour  Intake 2290 ml  Output 50 ml  Net 2240 ml     Assessment:  23 y.o. male is s/p:  1.  Thrombectomy left radial artery 2.  Primary repair left radial artery transection  1 Day Post-Op  Plan: -pt doing well with palpable left radial pulse and incision looks good with sutures in tact.  -plan for surgery on Monday with hand surgery for repair of superficial radial nerve laceration -will discharge home today with pain meds/abx and he will f/u with Dr. Randie Heinz in a  couple of weeks.   Doreatha Massed, PA-C [facility] [phone] 04/26/2018 8:22 AM   I have interviewed and examined patient with PA and agree with assessment and plan above.  He will have surgery with Dr. Janee Morn early next week.  I have asked him to take baby aspirin daily for his radial artery repair.  I will see him in a few weeks at which time he can likely be discharged from our clinic if he is doing well.  Brandon C. Randie Heinz, MD [facility] [place] Office: [phone] Pager: [phone]

## 2018-04-26 NOTE — Progress Notes (Signed)
04/26/2018 12:38 PM Discharge AVS meds taken today and those due this evening reviewed.  Follow-up appointments and when to call md reviewed.  D/C IV and TELE.  Questions and concerns addressed.   D/C home per orders. Kathryne Hitch

## 2018-04-26 NOTE — Discharge Summary (Signed)
Discharge Summary    Antonio Lewis 09-Jan-1996 23 y.o. male  [id number]  Admission Date: 04/25/2018  Discharge Date: 04/26/18  Physician: Juventino Slovakain, Brandon Christophe*  Admission Diagnosis: arm lac   HPI:   This is a 23 y.o. male presented after syncopal episode with associated fall and injury to left wrist with plate glass window.  Does not take any blood thinners.  Tourniquet has been in place for 1 hour.  Patient remembers tetanus shot 2 years ago with ankle injury.  Hospital Course:  The patient was admitted to the hospital and taken to the operating room on 04/25/2018 and underwent: 1.  Thrombectomy left radial artery 2.  Primary repair left radial artery transection    Findings: The radial artery itself was 50% transected and was primarily repaired.  There was acute thrombus distally that was removed with embolectomy.  At completion there was a palpable radial pulse distally to this.  There was a strong ulnar pulse after the tourniquet was taken down and there was a palmar arch signal at completion.  The pt tolerated the procedure well and was transported to the PACU in good condition.   By POD 1, he was doing well with a palpable left radial pulse distal to the incision.  He did have some numbness of the left thumb and 1st finger onto the wrist.  He is scheduled for nerve repair of the left superficial radial nerve on Monday with Dr. Janee Mornhompson.  His pain is well controlled.  He is discharged home with pain medication and abx.   The remainder of the hospital course consisted of increasing mobilization and increasing intake of solids without difficulty.  CBC    Component Value Date/Time   WBC 13.0 (H) 04/26/2018 0312   RBC 3.50 (L) 04/26/2018 0312   HGB 10.7 (L) 04/26/2018 0312   HCT 31.0 (L) 04/26/2018 0312   PLT 193 04/26/2018 0312   MCV 88.6 04/26/2018 0312   MCH 30.6 04/26/2018 0312   MCHC 34.5 04/26/2018 0312   RDW 12.0 04/26/2018 0312   LYMPHSABS 3.9 04/25/2018  1119   MONOABS 0.9 04/25/2018 1119   EOSABS 0.3 04/25/2018 1119   BASOSABS 0.1 04/25/2018 1119    BMET    Component Value Date/Time   NA 137 04/26/2018 0312   K 4.1 04/26/2018 0312   CL 104 04/26/2018 0312   CO2 25 04/26/2018 0312   GLUCOSE 146 (H) 04/26/2018 0312   BUN 13 04/26/2018 0312   CREATININE 1.09 04/26/2018 0312   CALCIUM 8.7 (L) 04/26/2018 0312   GFRNONAA >60 04/26/2018 0312   GFRAA >60 04/26/2018 [id number]      Discharge Instructions    Call MD for:  redness, tenderness, or signs of infection (pain, swelling, bleeding, redness, odor or green/yellow discharge around incision site)   Complete by:  As directed    Call MD for:  severe or increased pain, loss or decreased feeling  in affected limb(s)   Complete by:  As directed    Call MD for:  temperature >100.5   Complete by:  As directed    Discharge wound care:   Complete by:  As directed    Dry dressing to incision daily.   Driving Restrictions   Complete by:  As directed    No driving for 48 hours and while taking pain medication.   Lifting restrictions   Complete by:  As directed    No heavy lifting for 1 week and after that lifting restrictions per hand  Careers adviser.   Resume previous diet   Complete by:  As directed       Discharge Diagnosis:  arm lac  Secondary Diagnosis: Patient Active Problem List   Diagnosis Date Noted  . 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 Dizziness and giddiness 08/09/2012  . Depression with anxiety 08/09/2012  . Knee pain 04/14/2011  . Anterior knee pain 03/30/2011   Past Medical History:  Diagnosis Date  . Anxiety   . Depression   . Headache(784.0)   . Syncope and collapse      Allergies as of 04/26/2018   No Known Allergies     Medication List    TAKE these medications   ibuprofen 600 MG tablet Commonly known as:  ADVIL,MOTRIN Take 1 tablet (600 mg total) by mouth every 8 (eight) hours as needed.     oxyCODONE-acetaminophen 5-325 MG tablet Commonly known as:  PERCOCET Take 1 tablet by mouth every 6 (six) hours as needed for severe pain.            Discharge Care Instructions  (From admission, onward)         Start     Ordered   04/26/18 0000  Discharge wound care:    Comments:  Dry dressing to incision daily.   04/26/18 0848          Prescriptions given: 1.  Roxicet #10 No Refill 2.  Aspirin 81mg  daily 3.  Keflex 500mg  bid #14 No Refill  Instructions: 1.  No driving for 48 hours and while taking pain medication 2.  No heavy lifting x 1 week and then lifting restrictions per hand surgery 3.  Shower daily with soap and water starting 04/27/18 and then apply dry dressing to incision daily.  Disposition: home  Patient's condition: is Good  Follow up: 1. Dr. Randie Heinz in 2 weeks 2. [facility] surgery center 05/01/18   Doreatha Massed, PA-C [facility] [phone] 04/26/2018  8:49 AM

## 2018-04-27 ENCOUNTER — Telehealth: Payer: Self-pay | Admitting: Vascular Surgery

## 2018-04-27 NOTE — Telephone Encounter (Signed)
sch appt wrong number mld ltr 05/12/2018 4pm p/o MD

## 2018-04-27 NOTE — Telephone Encounter (Signed)
-----   Message from Dara Lords, [place] sent at 04/26/2018  8:45 AM EST ----- S/p repair or laceration of left radial artery 04/25/18.  F/u with Dr. Randie Heinz in a couple of weeks.   Thanks

## 2018-04-28 ENCOUNTER — Encounter (HOSPITAL_BASED_OUTPATIENT_CLINIC_OR_DEPARTMENT_OTHER): Payer: Self-pay | Admitting: *Deleted

## 2018-04-28 ENCOUNTER — Other Ambulatory Visit: Payer: Self-pay

## 2018-05-01 ENCOUNTER — Encounter (HOSPITAL_BASED_OUTPATIENT_CLINIC_OR_DEPARTMENT_OTHER): Payer: Self-pay | Admitting: Certified Registered"

## 2018-05-01 ENCOUNTER — Ambulatory Visit (HOSPITAL_BASED_OUTPATIENT_CLINIC_OR_DEPARTMENT_OTHER): Payer: Self-pay | Admitting: Certified Registered"

## 2018-05-01 ENCOUNTER — Encounter (HOSPITAL_BASED_OUTPATIENT_CLINIC_OR_DEPARTMENT_OTHER)
Admission: RE | Disposition: A | Discharge status: Home / Self Care | Payer: Self-pay | Source: Home / Self Care | Attending: Orthopedic Surgery

## 2018-05-01 ENCOUNTER — Ambulatory Visit (HOSPITAL_BASED_OUTPATIENT_CLINIC_OR_DEPARTMENT_OTHER)
Admission: RE | Admit: 2018-05-01 | Discharge: 2018-05-01 | Disposition: A | Discharge status: Home / Self Care | Payer: Self-pay | Attending: Orthopedic Surgery | Admitting: Orthopedic Surgery

## 2018-05-01 DIAGNOSIS — S6422XA Injury of radial nerve at wrist and hand level of left arm, initial encounter: Secondary | ICD-10-CM | POA: Insufficient documentation

## 2018-05-01 DIAGNOSIS — W19XXXA Unspecified fall, initial encounter: Secondary | ICD-10-CM | POA: Insufficient documentation

## 2018-05-01 DIAGNOSIS — S51812A Laceration without foreign body of left forearm, initial encounter: Secondary | ICD-10-CM | POA: Insufficient documentation

## 2018-05-01 DIAGNOSIS — F329 Major depressive disorder, single episode, unspecified: Secondary | ICD-10-CM | POA: Insufficient documentation

## 2018-05-01 DIAGNOSIS — F419 Anxiety disorder, unspecified: Secondary | ICD-10-CM | POA: Insufficient documentation

## 2018-05-01 DIAGNOSIS — D649 Anemia, unspecified: Secondary | ICD-10-CM | POA: Insufficient documentation

## 2018-05-01 DIAGNOSIS — R51 Headache: Secondary | ICD-10-CM | POA: Insufficient documentation

## 2018-05-01 DIAGNOSIS — Z820 Family history of epilepsy and other diseases of the nervous system: Secondary | ICD-10-CM | POA: Insufficient documentation

## 2018-05-01 DIAGNOSIS — Y939 Activity, unspecified: Secondary | ICD-10-CM | POA: Insufficient documentation

## 2018-05-01 DIAGNOSIS — Z791 Long term (current) use of non-steroidal anti-inflammatories (NSAID): Secondary | ICD-10-CM | POA: Insufficient documentation

## 2018-05-01 HISTORY — DX: Abrasion of right upper arm, initial encounter: S40.811A

## 2018-05-01 HISTORY — DX: Injury of radial nerve at forearm level, unspecified arm, initial encounter: S54.20XA

## 2018-05-01 HISTORY — DX: Personal history of other specified conditions: Z87.898

## 2018-05-01 HISTORY — DX: Psoriasis, unspecified: L40.9

## 2018-05-01 HISTORY — PX: NERVE REPAIR: SHX2083

## 2018-05-01 SURGERY — REPAIR, NERVE
Anesthesia: General | Site: Arm Lower | Laterality: Left

## 2018-05-01 MED ORDER — PROPOFOL 10 MG/ML IV BOLUS
INTRAVENOUS | Status: DC | PRN
  Administered 2018-05-01: 200 mg via INTRAVENOUS

## 2018-05-01 MED ORDER — SCOPOLAMINE 1 MG/3DAYS TD PT72: 1.0000 | MEDICATED_PATCH | Freq: Once | TRANSDERMAL | Status: DC | PRN

## 2018-05-01 MED ORDER — CHLORHEXIDINE GLUCONATE 4 % EX LIQD: 60.0000 mL | Freq: Once | CUTANEOUS | Status: DC

## 2018-05-01 MED ORDER — FENTANYL CITRATE (PF) 100 MCG/2ML IJ SOLN
INTRAMUSCULAR | Status: AC
  Filled 2018-05-01: qty 2

## 2018-05-01 MED ORDER — LACTATED RINGERS IV SOLN
INTRAVENOUS | Status: DC
  Administered 2018-05-01 (×2): via INTRAVENOUS

## 2018-05-01 MED ORDER — FENTANYL CITRATE (PF) 100 MCG/2ML IJ SOLN
50.0000 ug | INTRAMUSCULAR | Status: DC | PRN
  Administered 2018-05-01: 100 ug via INTRAVENOUS

## 2018-05-01 MED ORDER — ONDANSETRON HCL 4 MG/2ML IJ SOLN
INTRAMUSCULAR | Status: DC | PRN
  Administered 2018-05-01: 4 mg via INTRAVENOUS

## 2018-05-01 MED ORDER — PROPOFOL 500 MG/50ML IV EMUL
INTRAVENOUS | Status: DC | PRN
  Administered 2018-05-01: 25 ug/kg/min via INTRAVENOUS

## 2018-05-01 MED ORDER — OXYCODONE HCL 5 MG PO TABS: 5.0000 mg | ORAL_TABLET | Freq: Four times a day (QID) | ORAL | 0 refills | Status: DC | PRN

## 2018-05-01 MED ORDER — CEFAZOLIN SODIUM-DEXTROSE 2-4 GM/100ML-% IV SOLN
INTRAVENOUS | Status: AC
  Filled 2018-05-01: qty 100

## 2018-05-01 MED ORDER — MIDAZOLAM HCL 2 MG/2ML IJ SOLN
INTRAMUSCULAR | Status: AC
  Filled 2018-05-01: qty 2

## 2018-05-01 MED ORDER — OXYCODONE HCL 5 MG PO TABS
ORAL_TABLET | ORAL | Status: AC
  Filled 2018-05-01: qty 1

## 2018-05-01 MED ORDER — CEFAZOLIN SODIUM-DEXTROSE 2-4 GM/100ML-% IV SOLN
2.0000 g | INTRAVENOUS | Status: AC
  Administered 2018-05-01: 2 g via INTRAVENOUS

## 2018-05-01 MED ORDER — BUPIVACAINE-EPINEPHRINE (PF) 0.25% -1:200000 IJ SOLN
INTRAMUSCULAR | Status: DC | PRN
  Administered 2018-05-01: 10 mL via PERINEURAL

## 2018-05-01 MED ORDER — PROMETHAZINE HCL 25 MG/ML IJ SOLN: 6.2500 mg | INTRAMUSCULAR | Status: DC | PRN

## 2018-05-01 MED ORDER — IBUPROFEN 200 MG PO TABS: 600.0000 mg | ORAL_TABLET | Freq: Four times a day (QID) | ORAL | 0 refills | Status: DC

## 2018-05-01 MED ORDER — OXYCODONE HCL 5 MG PO TABS
5.0000 mg | ORAL_TABLET | Freq: Once | ORAL | Status: AC | PRN
  Administered 2018-05-01: 5 mg via ORAL

## 2018-05-01 MED ORDER — ACETAMINOPHEN 325 MG PO TABS: 650.0000 mg | ORAL_TABLET | Freq: Four times a day (QID) | ORAL | Status: DC

## 2018-05-01 MED ORDER — FENTANYL CITRATE (PF) 100 MCG/2ML IJ SOLN
25.0000 ug | INTRAMUSCULAR | Status: DC | PRN
  Administered 2018-05-01 (×2): 50 ug via INTRAVENOUS
  Administered 2018-05-01: 25 ug via INTRAVENOUS

## 2018-05-01 MED ORDER — MIDAZOLAM HCL 2 MG/2ML IJ SOLN
1.0000 mg | INTRAMUSCULAR | Status: DC | PRN
  Administered 2018-05-01: 2 mg via INTRAVENOUS

## 2018-05-01 MED ORDER — DEXAMETHASONE SODIUM PHOSPHATE 10 MG/ML IJ SOLN
INTRAMUSCULAR | Status: DC | PRN
  Administered 2018-05-01: 10 mg via INTRAVENOUS

## 2018-05-01 MED ORDER — LIDOCAINE HCL (CARDIAC) PF 100 MG/5ML IV SOSY
PREFILLED_SYRINGE | INTRAVENOUS | Status: DC | PRN
  Administered 2018-05-01: 60 mg via INTRAVENOUS

## 2018-05-01 SURGICAL SUPPLY — 70 items
BLADE MINI RND TIP GREEN BEAV (BLADE) IMPLANT
BLADE SURG 15 STRL LF DISP TIS (BLADE) ×1 IMPLANT
BLADE SURG 15 STRL SS (BLADE) ×2
BNDG COHESIVE 4X5 TAN STRL (GAUZE/BANDAGES/DRESSINGS) ×3 IMPLANT
BNDG ESMARK 4X9 LF (GAUZE/BANDAGES/DRESSINGS) ×2 IMPLANT
BNDG GAUZE ELAST 4 BULKY (GAUZE/BANDAGES/DRESSINGS) ×3 IMPLANT
CHLORAPREP W/TINT 26ML (MISCELLANEOUS) ×3 IMPLANT
CORD BIPOLAR FORCEPS 12FT (ELECTRODE) ×3 IMPLANT
COVER BACK TABLE 60X90IN (DRAPES) ×3 IMPLANT
COVER MAYO STAND STRL (DRAPES) ×3 IMPLANT
COVER WAND RF STERILE (DRAPES) IMPLANT
CUFF TOURNIQUET SINGLE 18IN (TOURNIQUET CUFF) ×2 IMPLANT
DECANTER SPIKE VIAL GLASS SM (MISCELLANEOUS) IMPLANT
DRAPE EXTREMITY T 121X128X90 (DISPOSABLE) ×3 IMPLANT
DRAPE SURG 17X23 STRL (DRAPES) ×3 IMPLANT
DRSG EMULSION OIL 3X3 NADH (GAUZE/BANDAGES/DRESSINGS) ×3 IMPLANT
ELECT REM PT RETURN 9FT ADLT (ELECTROSURGICAL)
ELECTRODE REM PT RTRN 9FT ADLT (ELECTROSURGICAL) IMPLANT
GAUZE SPONGE 4X4 12PLY STRL LF (GAUZE/BANDAGES/DRESSINGS) ×3 IMPLANT
GLOVE BIO SURGEON STRL SZ7.5 (GLOVE) ×3 IMPLANT
GLOVE BIOGEL PI IND STRL 7.0 (GLOVE) ×1 IMPLANT
GLOVE BIOGEL PI IND STRL 8 (GLOVE) ×1 IMPLANT
GLOVE BIOGEL PI INDICATOR 7.0 (GLOVE) ×4
GLOVE BIOGEL PI INDICATOR 8 (GLOVE) ×4
GLOVE ECLIPSE 6.5 STRL STRAW (GLOVE) ×3 IMPLANT
GLOVE SURG SYN 8.0 (GLOVE) ×6 IMPLANT
GLOVE SURG SYN 8.0 PF PI (GLOVE) IMPLANT
GOWN STRL REIN XL XLG (GOWN DISPOSABLE) ×2 IMPLANT
GOWN STRL REUS W/ TWL LRG LVL3 (GOWN DISPOSABLE) ×2 IMPLANT
GOWN STRL REUS W/TWL LRG LVL3 (GOWN DISPOSABLE) ×2
GOWN STRL REUS W/TWL XL LVL3 (GOWN DISPOSABLE) ×3 IMPLANT
LOOP VESSEL MAXI BLUE (MISCELLANEOUS) IMPLANT
LOOP VESSEL MINI RED (MISCELLANEOUS) IMPLANT
NDL HYPO 25X1 1.5 SAFETY (NEEDLE) IMPLANT
NEEDLE HYPO 25X1 1.5 SAFETY (NEEDLE) ×3 IMPLANT
NS IRRIG 1000ML POUR BTL (IV SOLUTION) ×3 IMPLANT
PACK BASIN DAY SURGERY FS (CUSTOM PROCEDURE TRAY) ×3 IMPLANT
PADDING CAST ABS 3INX4YD NS (CAST SUPPLIES)
PADDING CAST ABS 4INX4YD NS (CAST SUPPLIES) ×2
PADDING CAST ABS COTTON 3X4 (CAST SUPPLIES) IMPLANT
PADDING CAST ABS COTTON 4X4 ST (CAST SUPPLIES) ×1 IMPLANT
PENCIL BUTTON HOLSTER BLD 10FT (ELECTRODE) IMPLANT
SLEEVE SCD COMPRESS KNEE MED (MISCELLANEOUS) ×2 IMPLANT
SLING ARM FOAM STRAP LRG (SOFTGOODS) IMPLANT
SPEAR EYE SURG WECK-CEL (MISCELLANEOUS) ×3 IMPLANT
SPLINT FAST PLASTER 5X30 (CAST SUPPLIES)
SPLINT PLASTER CAST FAST 5X30 (CAST SUPPLIES) IMPLANT
SPLINT PLASTER CAST XFAST 3X15 (CAST SUPPLIES) IMPLANT
SPLINT PLASTER XTRA FASTSET 3X (CAST SUPPLIES) ×16
STOCKINETTE 6  STRL (DRAPES) ×2
STOCKINETTE 6 STRL (DRAPES) ×1 IMPLANT
SUCTION FRAZIER HANDLE 10FR (MISCELLANEOUS) ×2
SUCTION TUBE FRAZIER 10FR DISP (MISCELLANEOUS) IMPLANT
SUT ETHIBOND 3-0 V-5 (SUTURE) ×1 IMPLANT
SUT ETHILON 8 0 BV130 4 (SUTURE) ×5 IMPLANT
SUT ETHILON 9 0 V 100.4 (SUTURE) ×1 IMPLANT
SUT FIBERWIRE 2-0 18 17.9 3/8 (SUTURE)
SUT NYLON 9 0 VRM6 (SUTURE) IMPLANT
SUT PROLENE 6 0 P 1 18 (SUTURE) ×1 IMPLANT
SUT SILK 4 0 PS 2 (SUTURE) IMPLANT
SUT STEEL 4 (SUTURE) ×1 IMPLANT
SUT VICRYL RAPIDE 4-0 (SUTURE) IMPLANT
SUT VICRYL RAPIDE 4/0 PS 2 (SUTURE) ×3 IMPLANT
SUTURE FIBERWR 2-0 18 17.9 3/8 (SUTURE) IMPLANT
SYR 10ML LL (SYRINGE) ×2 IMPLANT
SYR BULB 3OZ (MISCELLANEOUS) ×3 IMPLANT
TOWEL GREEN STERILE FF (TOWEL DISPOSABLE) ×3 IMPLANT
TUBE CONNECTING 20'X1/4 (TUBING) ×1
TUBE CONNECTING 20X1/4 (TUBING) ×1 IMPLANT
UNDERPAD 30X30 (UNDERPADS AND DIAPERS) ×3 IMPLANT

## 2018-05-01 NOTE — Op Note (Signed)
05/01/2018  10:37 AM  PATIENT:  Antonio Lewis  23 y.o. male  PRE-OPERATIVE DIAGNOSIS:  Left forearm laceration with suspected superficial radial nerve laceration  POST-OPERATIVE DIAGNOSIS:  Same, with confirmed laceration  PROCEDURE:  1. Removal of sutures left forearm wound    2. Left forearm superficial radial nerve repair    3. Left forearm traumatic wound closure, intermediate, 4cm    4. Excisional debridement left forearm wound, skin and subcutaneous tissues, 4 cm  SURGEON: Cliffton Asters. Janee Morn, MD  PHYSICIAN ASSISTANT: Danielle Rankin, OPA-C  ANESTHESIA:  general  SPECIMENS:  None  DRAINS:   None  EBL:  less than 50 mL  PREOPERATIVE INDICATIONS:  Antonio Lewis is a  23 y.o. male with left forearm laceration, having sustained a radial artery injury and underwent subsequent repair last week.  Intraoperatively, it was noted that he likely had a superficial radial nerve laceration and he presents today for definitive management of that injury.  The risks benefits and alternatives were discussed with the patient preoperatively including but not limited to the risks of infection, bleeding, nerve injury, cardiopulmonary complications, the need for revision surgery, among others, and the patient verbalized understanding and consented to proceed.  OPERATIVE IMPLANTS: None  OPERATIVE PROCEDURE:  After receiving prophylactic antibiotics, the patient was escorted to the operative theatre and placed in a supine position.  General anesthesia was administered.  A surgical "time-out" was performed during which the planned procedure, proposed operative site, and the correct patient identity were compared to the operative consent and agreement confirmed by the circulating nurse according to current facility policy.  Following application of a tourniquet to the operative extremity, the exposed skin was prepped with Chloraprep and draped in the usual sterile fashion.  The limb was exsanguinated with  an Esmarch bandage and the tourniquet inflated to approximately higher than systolic BP.  The sutures from the wound were removed.  The wound was opened through blunt dissection.  Some jagged nonviable skin and subcutaneous tissues at the edges were freshened with a scalpel.  This included excisional debridement of skin and subcutaneous tissues.  Visualization was inadequate, so the wound was extended longitudinally with surgical incisions.  Flaps were elevated and reflected.  The superficial radial nerve was found to been divided, with 1 major core, and a small accessory nerve next to it which may have been terminal portions of the lateral antebrachial cutaneous nerve versus just another smaller branch of the S RN.  Direct repair could be performed.  The nerve endings were then debrided sharply with a scalpel, the wound irrigated, and using standard microsurgical techniques and for probably magnification direct primary neurorrhaphy was performed with 8-0 nylon interrupted epineurial sutures, repairing the 2 different nerve branches independently  The wound was irrigated, the tourniquet released, radial pulse confirmed via palpation, and the traumatic wound reapproximated with 4-0 Vicryl Rapide interrupted sutures.  4-0 Vicryl Rapide interrupted sutures was also used to close the surgical incisions.  Marcaine with epinephrine was instilled into the subcutaneous tissues about the field to help with postoperative pain control.  Short arm splint dressing was applied and he was taken to the recovery room in stable condition, breathing spontaneously.  DISPOSITION: He will be discharged home back to instructions, returning 10 to 15 days for reevaluation.  No x-rays required.

## 2018-05-01 NOTE — Discharge Instructions (Signed)
Discharge Instructions   You have a dressing with a plaster splint incorporated in it. Move your fingers as much as possible, making a full fist and fully opening the fist. Elevate your hand to reduce pain & swelling of the digits.  Ice over the operative site may be helpful to reduce pain & swelling.  DO NOT USE HEAT. Pain medicine has been prescribed for you.  Tylenol 650 mg and Ibuprofen 600 mg taken together every 6 hours. Take Oxycodone additionally for severe post operative pain control. Leave the dressing in place until you return to our office.  You may shower, but keep the bandage clean & dry.  You may drive a car when you are off of prescription pain medications and can safely control your vehicle with both hands. Make an appointment with our office for 10-15 days from the date of surgery.   Please call [phone] during normal business hours or [phone] after hours for any problems. Including the following:  - excessive redness of the incisions - drainage for more than 4 days - fever of more than 101.5 F  *Please note that pain medications will not be refilled after hours or on weekends.  WORK STATUS: No work with the left arm until follow up appointment.    Post Anesthesia Home Care Instructions  Activity: Get plenty of rest for the remainder of the day. A responsible individual must stay with you for 24 hours following the procedure.  For the next 24 hours, DO NOT: -Drive a car -Advertising copywriter -Drink alcoholic beverages -Take any medication unless instructed by your physician -Make any legal decisions or sign important papers.  Meals: Start with liquid foods such as gelatin or soup. Progress to regular foods as tolerated. Avoid greasy, spicy, heavy foods. If nausea and/or vomiting occur, drink only clear liquids until the nausea and/or vomiting subsides. Call your physician if vomiting continues.  Special Instructions/Symptoms: Your throat may feel dry  or sore from the anesthesia or the breathing tube placed in your throat during surgery. If this causes discomfort, gargle with warm salt water. The discomfort should disappear within 24 hours.  If you had a scopolamine patch placed behind your ear for the management of post- operative nausea and/or vomiting:  1. The medication in the patch is effective for 72 hours, after which it should be removed.  Wrap patch in a tissue and discard in the trash. Wash hands thoroughly with soap and water. 2. You may remove the patch earlier than 72 hours if you experience unpleasant side effects which may include dry mouth, dizziness or visual disturbances. 3. Avoid touching the patch. Wash your hands with soap and water after contact with the patch.     YOU RECEIVED OXYCODONE IR 5 MG AT 1:00 PM.YOU CAN TAKE ANOTHER PAIN PILL AFTER 7:00 PM IF NEEDED

## 2018-05-01 NOTE — Anesthesia Procedure Notes (Signed)
Procedure Name: LMA Insertion Date/Time: 05/01/2018 10:56 AM Performed by: Sheryn Bison, CRNA Pre-anesthesia Checklist: Patient identified, Emergency Drugs available, Suction available and Patient being monitored Patient Re-evaluated:Patient Re-evaluated prior to induction Oxygen Delivery Method: Circle system utilized Preoxygenation: Pre-oxygenation with 100% oxygen Induction Type: IV induction Ventilation: Mask ventilation without difficulty LMA: LMA inserted LMA Size: 4.0 Number of attempts: 1 Airway Equipment and Method: Bite block Placement Confirmation: positive ETCO2 Tube secured with: Tape Dental Injury: Teeth and Oropharynx as per pre-operative assessment

## 2018-05-01 NOTE — Interval H&P Note (Signed)
History and Physical Interval Note:  05/01/2018 10:37 AM  Antonio Lewis  has presented today for surgery, with the diagnosis of left forearm nerve laceration  The various methods of treatment have been discussed with the patient and family. After consideration of risks, benefits and other options for treatment, the patient has consented to  Procedure(s): left forearm nerve repair (Left) as a surgical intervention .  The patient's history has been reviewed, patient examined, no change in status, stable for surgery.  I have reviewed the patient's chart and labs.  Questions were answered to the patient's satisfaction.     Jodi Marble

## 2018-05-01 NOTE — Anesthesia Postprocedure Evaluation (Signed)
Anesthesia Post Note  Patient: Antonio Lewis  Procedure(s) Performed: left forearm nerve repair (Left Arm Lower)     Patient location during evaluation: PACU Anesthesia Type: General Level of consciousness: awake and alert, awake and oriented Pain management: pain level controlled Vital Signs Assessment: post-procedure vital signs reviewed and stable Respiratory status: spontaneous breathing, nonlabored ventilation and respiratory function stable Cardiovascular status: blood pressure returned to baseline and stable Postop Assessment: no apparent nausea or vomiting Anesthetic complications: no    Last Vitals:  Vitals:   05/01/18 1230 05/01/18 1245  BP: (!) 146/57 (!) 143/63  Pulse: 76 83  Resp: 10 11  Temp:    SpO2: 99% 100%    Last Pain:  Vitals:   05/01/18 1245  TempSrc:   PainSc: 3                  Cecile Hearing

## 2018-05-01 NOTE — Transfer of Care (Signed)
Immediate Anesthesia Transfer of Care Note  Patient: Antonio Lewis  Procedure(s) Performed: left forearm nerve repair (Left Arm Lower)  Patient Location: PACU  Anesthesia Type:General  Level of Consciousness: awake, alert , oriented and patient cooperative  Airway & Oxygen Therapy: Patient Spontanous Breathing and Patient connected to face mask oxygen  Post-op Assessment: Report given to RN and Post -op Vital signs reviewed and stable  Post vital signs: Reviewed and stable  Last Vitals:  Vitals Value Taken Time  BP    Temp    Pulse 103 05/01/2018 11:56 AM  Resp 13 05/01/2018 11:56 AM  SpO2 100 % 05/01/2018 11:56 AM  Vitals shown include unvalidated device data.  Last Pain:  Vitals:   05/01/18 0833  TempSrc: Oral  PainSc:          Complications: No apparent anesthesia complications

## 2018-05-01 NOTE — Anesthesia Preprocedure Evaluation (Signed)
Anesthesia Evaluation  Patient identified by MRN, date of birth, ID band Patient awake    Reviewed: Allergy & Precautions, NPO status , Patient's Chart, lab work & pertinent test results  Airway Mallampati: II  TM Distance: >3 FB Neck ROM: Full    Dental  (+) Teeth Intact, Dental Advisory Given   Pulmonary Current Smoker,    Pulmonary exam normal breath sounds clear to auscultation       Cardiovascular Exercise Tolerance: Good negative cardio ROS Normal cardiovascular exam Rhythm:Regular Rate:Normal     Neuro/Psych  Headaches, PSYCHIATRIC DISORDERS Anxiety Depression    GI/Hepatic negative GI ROS, Neg liver ROS,   Endo/Other  negative endocrine ROS  Renal/GU negative Renal ROS     Musculoskeletal Left radial nerve laceration    Abdominal   Peds  Hematology  (+) Blood dyscrasia, anemia ,   Anesthesia Other Findings Day of surgery medications reviewed with the patient.  Reproductive/Obstetrics                             Anesthesia Physical Anesthesia Plan  ASA: II  Anesthesia Plan: General   Post-op Pain Management:    Induction: Intravenous  PONV Risk Score and Plan: 1 and Dexamethasone, Ondansetron and Midazolam  Airway Management Planned: LMA  Additional Equipment:   Intra-op Plan:   Post-operative Plan: Extubation in OR  Informed Consent: I have reviewed the patients History and Physical, chart, labs and discussed the procedure including the risks, benefits and alternatives for the proposed anesthesia with the patient or authorized representative who has indicated his/her understanding and acceptance.   Dental advisory given  Plan Discussed with: CRNA  Anesthesia Plan Comments:         Anesthesia Quick Evaluation

## 2018-05-03 ENCOUNTER — Encounter (HOSPITAL_BASED_OUTPATIENT_CLINIC_OR_DEPARTMENT_OTHER): Payer: Self-pay | Admitting: Orthopedic Surgery

## 2018-05-12 ENCOUNTER — Encounter: Payer: Self-pay | Admitting: Vascular Surgery

## 2019-02-05 ENCOUNTER — Other Ambulatory Visit: Payer: Self-pay

## 2019-02-05 DIAGNOSIS — Z20822 Contact with and (suspected) exposure to covid-19: Secondary | ICD-10-CM

## 2019-02-06 LAB — NOVEL CORONAVIRUS, NAA: SARS-CoV-2, NAA: NOT DETECTED

## 2019-08-24 ENCOUNTER — Emergency Department (HOSPITAL_COMMUNITY)
Admission: EM | Admit: 2019-08-24 | Discharge: 2019-08-24 | Disposition: A | Discharge status: Home / Self Care | Payer: No Typology Code available for payment source | Attending: Emergency Medicine | Admitting: Emergency Medicine

## 2019-08-24 ENCOUNTER — Emergency Department (HOSPITAL_COMMUNITY): Payer: No Typology Code available for payment source

## 2019-08-24 ENCOUNTER — Encounter (HOSPITAL_COMMUNITY): Payer: Self-pay | Admitting: Emergency Medicine

## 2019-08-24 ENCOUNTER — Other Ambulatory Visit: Payer: Self-pay

## 2019-08-24 DIAGNOSIS — Y999 Unspecified external cause status: Secondary | ICD-10-CM | POA: Insufficient documentation

## 2019-08-24 DIAGNOSIS — M545 Low back pain: Secondary | ICD-10-CM | POA: Insufficient documentation

## 2019-08-24 DIAGNOSIS — Z79899 Other long term (current) drug therapy: Secondary | ICD-10-CM | POA: Insufficient documentation

## 2019-08-24 DIAGNOSIS — Y939 Activity, unspecified: Secondary | ICD-10-CM | POA: Insufficient documentation

## 2019-08-24 DIAGNOSIS — Z23 Encounter for immunization: Secondary | ICD-10-CM | POA: Diagnosis not present

## 2019-08-24 DIAGNOSIS — Y929 Unspecified place or not applicable: Secondary | ICD-10-CM | POA: Diagnosis not present

## 2019-08-24 DIAGNOSIS — M25511 Pain in right shoulder: Secondary | ICD-10-CM | POA: Diagnosis not present

## 2019-08-24 DIAGNOSIS — S0001XA Abrasion of scalp, initial encounter: Secondary | ICD-10-CM | POA: Diagnosis not present

## 2019-08-24 DIAGNOSIS — S0990XA Unspecified injury of head, initial encounter: Secondary | ICD-10-CM | POA: Diagnosis present

## 2019-08-24 DIAGNOSIS — M25551 Pain in right hip: Secondary | ICD-10-CM | POA: Insufficient documentation

## 2019-08-24 DIAGNOSIS — T07XXXA Unspecified multiple injuries, initial encounter: Secondary | ICD-10-CM

## 2019-08-24 DIAGNOSIS — R102 Pelvic and perineal pain: Secondary | ICD-10-CM | POA: Insufficient documentation

## 2019-08-24 DIAGNOSIS — F17210 Nicotine dependence, cigarettes, uncomplicated: Secondary | ICD-10-CM | POA: Insufficient documentation

## 2019-08-24 MED ORDER — TETANUS-DIPHTH-ACELL PERTUSSIS 5-2.5-18.5 LF-MCG/0.5 IM SUSP
0.5000 mL | Freq: Once | INTRAMUSCULAR | Status: AC
  Administered 2019-08-24: 14:00:00 0.5 mL via INTRAMUSCULAR
  Filled 2019-08-24 (×2): qty 0.5

## 2019-08-24 MED ORDER — OXYCODONE-ACETAMINOPHEN 5-325 MG PO TABS
2.0000 | ORAL_TABLET | Freq: Once | ORAL | Status: AC
  Administered 2019-08-24: 2 via ORAL
  Filled 2019-08-24 (×2): qty 2

## 2019-08-24 MED ORDER — OXYCODONE-ACETAMINOPHEN 5-325 MG PO TABS: 1.0000 | ORAL_TABLET | ORAL | 0 refills | Status: DC | PRN

## 2019-08-24 NOTE — ED Notes (Signed)
PA Harris made aware of patient, in triage to assess

## 2019-08-24 NOTE — Discharge Instructions (Signed)
X-rays obtained of neck, back, shoulder, and hip do not show any evidence of fracture. If you continue to have back pain, numbness, tingling, or weakness of your extremities, please return for reevaluation. Your blood pressure was high in the emergency department, this may be secondary to your pain in the situation, you should have your blood pressure rechecked as an outpatient when you are feeling better.

## 2019-08-24 NOTE — ED Notes (Signed)
Patient verbalizes understanding of discharge instructions. Opportunity for questioning and answers were provided. Armband removed by staff, pt discharged from ED and ambulated to car with mother to go home.
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
## 2019-08-24 NOTE — ED Notes (Signed)
Pt transported to XRAY °

## 2019-12-19 ENCOUNTER — Emergency Department (HOSPITAL_COMMUNITY)
Admission: EM | Admit: 2019-12-19 | Discharge: 2019-12-20 | Disposition: A | Discharge status: Home / Self Care | Payer: Self-pay | Attending: Emergency Medicine | Admitting: Emergency Medicine

## 2019-12-19 ENCOUNTER — Encounter (HOSPITAL_COMMUNITY): Payer: Self-pay

## 2019-12-19 DIAGNOSIS — R1030 Lower abdominal pain, unspecified: Secondary | ICD-10-CM | POA: Insufficient documentation

## 2019-12-19 DIAGNOSIS — Z5321 Procedure and treatment not carried out due to patient leaving prior to being seen by health care provider: Secondary | ICD-10-CM | POA: Insufficient documentation

## 2019-12-19 DIAGNOSIS — R509 Fever, unspecified: Secondary | ICD-10-CM | POA: Insufficient documentation

## 2019-12-19 DIAGNOSIS — R1110 Vomiting, unspecified: Secondary | ICD-10-CM | POA: Insufficient documentation

## 2019-12-19 LAB — CBC
HCT: 43.5 % (ref 39.0–52.0)
Hemoglobin: 14.3 g/dL (ref 13.0–17.0)
MCH: 30 pg (ref 26.0–34.0)
MCHC: 32.9 g/dL (ref 30.0–36.0)
MCV: 91.2 fL (ref 80.0–100.0)
Platelets: 141 10*3/uL — ABNORMAL LOW (ref 150–400)
RBC: 4.77 MIL/uL (ref 4.22–5.81)
RDW: 12.1 % (ref 11.5–15.5)
WBC: 3.3 10*3/uL — ABNORMAL LOW (ref 4.0–10.5)
nRBC: 0 % (ref 0.0–0.2)

## 2019-12-19 LAB — URINALYSIS, ROUTINE W REFLEX MICROSCOPIC
Bilirubin Urine: NEGATIVE
Glucose, UA: NEGATIVE mg/dL
Hgb urine dipstick: NEGATIVE
Ketones, ur: NEGATIVE mg/dL
Leukocytes,Ua: NEGATIVE
Nitrite: NEGATIVE
Protein, ur: NEGATIVE mg/dL
Specific Gravity, Urine: 1.031 — ABNORMAL HIGH (ref 1.005–1.030)
pH: 6 (ref 5.0–8.0)

## 2019-12-19 LAB — LIPASE, BLOOD: Lipase: 35 U/L (ref 11–51)

## 2019-12-19 LAB — COMPREHENSIVE METABOLIC PANEL
ALT: 19 U/L (ref 0–44)
AST: 22 U/L (ref 15–41)
Albumin: 4.2 g/dL (ref 3.5–5.0)
Alkaline Phosphatase: 74 U/L (ref 38–126)
Anion gap: 10 (ref 5–15)
BUN: 18 mg/dL (ref 6–20)
CO2: 26 mmol/L (ref 22–32)
Calcium: 8.8 mg/dL — ABNORMAL LOW (ref 8.9–10.3)
Chloride: 104 mmol/L (ref 98–111)
Creatinine, Ser: 1.08 mg/dL (ref 0.61–1.24)
GFR calc Af Amer: >60 mL/min (ref >60)
GFR calc non Af Amer: >60 mL/min (ref >60)
Glucose, Bld: 95 mg/dL (ref 70–99)
Potassium: 3.8 mmol/L (ref 3.5–5.1)
Sodium: 140 mmol/L (ref 135–145)
Total Bilirubin: 0.5 mg/dL (ref 0.3–1.2)
Total Protein: 7.3 g/dL (ref 6.5–8.1)

## 2019-12-19 MED ORDER — ONDANSETRON 4 MG PO TBDP: 4.0000 mg | ORAL_TABLET | Freq: Once | ORAL | Status: DC | PRN

## 2019-12-19 NOTE — ED Notes (Signed)
Pt states he will go to another hospital to be seen. Seen leaving ED with steady gait.

## 2019-12-19 NOTE — ED Triage Notes (Signed)
Pt reports lower abd pain, emesis and fever for the past few days. No urinary s/s. Pt a.o, afebrile in triage

## 2019-12-20 NOTE — ED Notes (Signed)
NA X2 

## 2020-10-28 ENCOUNTER — Emergency Department (HOSPITAL_BASED_OUTPATIENT_CLINIC_OR_DEPARTMENT_OTHER)
Admission: EM | Admit: 2020-10-28 | Discharge: 2020-10-28 | Disposition: A | Discharge status: Home / Self Care | Payer: Managed Care, Other (non HMO) | Attending: Emergency Medicine | Admitting: Emergency Medicine

## 2020-10-28 DIAGNOSIS — R110 Nausea: Secondary | ICD-10-CM | POA: Insufficient documentation

## 2020-10-28 DIAGNOSIS — Z7982 Long term (current) use of aspirin: Secondary | ICD-10-CM | POA: Insufficient documentation

## 2020-10-28 DIAGNOSIS — R197 Diarrhea, unspecified: Secondary | ICD-10-CM | POA: Insufficient documentation

## 2020-10-28 DIAGNOSIS — F17210 Nicotine dependence, cigarettes, uncomplicated: Secondary | ICD-10-CM | POA: Insufficient documentation

## 2020-10-28 DIAGNOSIS — E860 Dehydration: Secondary | ICD-10-CM | POA: Insufficient documentation

## 2020-10-28 NOTE — ED Triage Notes (Signed)
Pt states he is here today for a work note because he had to leave work. States he had been having diarrhea and was dehydrated. Tried to go to work today but was too tired and had to leave.  States no longer having diarrhea and is tolerating PO.
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
## 2022-05-13 ENCOUNTER — Ambulatory Visit (INDEPENDENT_AMBULATORY_CARE_PROVIDER_SITE_OTHER): Payer: 59 | Admitting: Psychiatry

## 2022-05-13 ENCOUNTER — Telehealth: Payer: Self-pay | Admitting: Psychiatry

## 2022-05-13 ENCOUNTER — Encounter: Payer: Self-pay | Admitting: Psychiatry

## 2022-05-13 VITALS — BP 133/99 | HR 66 | Ht 74.0 in | Wt 147.4 lb

## 2022-05-13 DIAGNOSIS — R41840 Attention and concentration deficit: Secondary | ICD-10-CM | POA: Diagnosis not present

## 2022-05-13 DIAGNOSIS — Z0283 Encounter for blood-alcohol and blood-drug test: Secondary | ICD-10-CM

## 2022-05-13 DIAGNOSIS — F32A Depression, unspecified: Secondary | ICD-10-CM | POA: Diagnosis not present

## 2022-05-13 MED ORDER — BUPROPION HCL ER (XL) 150 MG PO TB24: 150.0000 mg | ORAL_TABLET | Freq: Every day | ORAL | 1 refills | Status: DC

## 2022-05-13 NOTE — Telephone Encounter (Signed)
fyi

## 2022-05-13 NOTE — Telephone Encounter (Signed)
Antonio Lewis called at 11:00 to report that he went ahead and had his labs done this morning.
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
## 2022-05-14 ENCOUNTER — Telehealth: Payer: Self-pay | Admitting: Psychiatry

## 2022-05-14 NOTE — Telephone Encounter (Signed)
LVM to RC 

## 2022-05-14 NOTE — Telephone Encounter (Signed)
Pt called concerned about med side effects. He was given Rx for Wellbutrin yesterday. He did tell provider he consumes a high amount of caffeine. RTC to pt today @ [phone]

## 2022-05-17 NOTE — Telephone Encounter (Signed)
Left second VM to RC. Patient works third shift.

## 2022-05-18 LAB — DRUG SCREEN 764883 11+OXYCO+ALC+CRT-BUND
Amphetamines, Urine: NEGATIVE ng/mL
BENZODIAZ UR QL: NEGATIVE ng/mL
Barbiturate: NEGATIVE ng/mL
Cocaine (Metabolite): NEGATIVE ng/mL
Creatinine: 84.6 mg/dL (ref 20.0–300.0)
Ethanol: NEGATIVE %
Meperidine: NEGATIVE ng/mL
Methadone Screen, Urine: NEGATIVE ng/mL
OPIATE SCREEN URINE: NEGATIVE ng/mL
Oxycodone/Oxymorphone, Urine: NEGATIVE ng/mL
Phencyclidine: NEGATIVE ng/mL
Propoxyphene: NEGATIVE ng/mL
Tramadol: NEGATIVE ng/mL
pH, Urine: 8.1 (ref 4.5–8.9)

## 2022-05-18 LAB — CANNABINOID CONFIRMATION, UR
CANNABINOIDS: POSITIVE — AB
Carboxy THC GC/MS Conf: >750 ng/mL

## 2022-05-18 NOTE — Telephone Encounter (Signed)
FYI: Patient said he is not able to reduce his caffeine intake at this time and has decided to hold off on taking the Wellbutrin.

## 2022-05-19 NOTE — Telephone Encounter (Signed)
We will hold off on any medicines until his caffeine intake is reduced, including stimulants

## 2022-06-11 ENCOUNTER — Encounter: Payer: Self-pay | Admitting: Psychiatry

## 2022-06-11 ENCOUNTER — Other Ambulatory Visit: Payer: Self-pay | Admitting: Psychiatry

## 2022-06-11 ENCOUNTER — Ambulatory Visit (INDEPENDENT_AMBULATORY_CARE_PROVIDER_SITE_OTHER): Payer: 59 | Admitting: Psychiatry

## 2022-06-11 VITALS — Ht 74.0 in | Wt 147.0 lb

## 2022-06-11 DIAGNOSIS — Z79899 Other long term (current) drug therapy: Secondary | ICD-10-CM

## 2022-06-11 DIAGNOSIS — F902 Attention-deficit hyperactivity disorder, combined type: Secondary | ICD-10-CM

## 2022-06-11 MED ORDER — AMPHETAMINE-DEXTROAMPHET ER 10 MG PO CP24: 10.0000 mg | ORAL_CAPSULE | Freq: Every day | ORAL | 0 refills | Status: DC

## 2022-06-12 LAB — COMPREHENSIVE METABOLIC PANEL
ALT: 15 IU/L (ref 0–44)
AST: 17 IU/L (ref 0–40)
Albumin/Globulin Ratio: 1.9 (ref 1.2–2.2)
Albumin: 4.8 g/dL (ref 4.3–5.2)
Alkaline Phosphatase: 112 IU/L (ref 44–121)
BUN/Creatinine Ratio: 14 (ref 9–20)
BUN: 14 mg/dL (ref 6–20)
Bilirubin Total: 0.4 mg/dL (ref 0.0–1.2)
CO2: 24 mmol/L (ref 20–29)
Calcium: 9.5 mg/dL (ref 8.7–10.2)
Chloride: 99 mmol/L (ref 96–106)
Creatinine, Ser: 0.98 mg/dL (ref 0.76–1.27)
Globulin, Total: 2.5 g/dL (ref 1.5–4.5)
Glucose: 76 mg/dL (ref 70–99)
Potassium: 4.2 mmol/L (ref 3.5–5.2)
Sodium: 141 mmol/L (ref 134–144)
Total Protein: 7.3 g/dL (ref 6.0–8.5)
eGFR: 110 mL/min/{1.73_m2} (ref >59)

## 2022-06-12 LAB — CBC WITH DIFFERENTIAL/PLATELET
Basophils Absolute: 0.1 10*3/uL (ref 0.0–0.2)
Basos: 1 %
EOS (ABSOLUTE): 0.1 10*3/uL (ref 0.0–0.4)
Eos: 1 %
Hematocrit: 45.4 % (ref 37.5–51.0)
Hemoglobin: 15.3 g/dL (ref 13.0–17.7)
Immature Grans (Abs): 0 10*3/uL (ref 0.0–0.1)
Immature Granulocytes: 0 %
Lymphocytes Absolute: 1.1 10*3/uL (ref 0.7–3.1)
Lymphs: 11 %
MCH: 30.8 pg (ref 26.6–33.0)
MCHC: 33.7 g/dL (ref 31.5–35.7)
MCV: 91 fL (ref 79–97)
Monocytes Absolute: 0.7 10*3/uL (ref 0.1–0.9)
Monocytes: 8 %
Neutrophils Absolute: 8 10*3/uL — ABNORMAL HIGH (ref 1.4–7.0)
Neutrophils: 79 %
Platelets: 226 10*3/uL (ref 150–450)
RBC: 4.97 x10E6/uL (ref 4.14–5.80)
RDW: 12.3 % (ref 11.6–15.4)
WBC: 9.9 10*3/uL (ref 3.4–10.8)

## 2022-06-12 LAB — TSH: TSH: 0.847 u[IU]/mL (ref 0.450–4.500)

## 2022-06-15 ENCOUNTER — Encounter: Payer: Self-pay | Admitting: Psychiatry
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
## 2022-07-09 ENCOUNTER — Encounter: Payer: Self-pay | Admitting: Psychiatry

## 2022-07-09 ENCOUNTER — Ambulatory Visit (INDEPENDENT_AMBULATORY_CARE_PROVIDER_SITE_OTHER): Payer: 59 | Admitting: Psychiatry

## 2022-07-09 VITALS — Wt 150.0 lb

## 2022-07-09 DIAGNOSIS — F902 Attention-deficit hyperactivity disorder, combined type: Secondary | ICD-10-CM | POA: Diagnosis not present

## 2022-07-09 MED ORDER — AMPHETAMINE-DEXTROAMPHET ER 15 MG PO CP24: 15.0000 mg | ORAL_CAPSULE | Freq: Every day | ORAL | 0 refills | Status: DC

## 2022-07-09 NOTE — Progress Notes (Signed)
[address], [place]    Follow-up visit  Date of Service: 07/09/2022  CC/Purpose: Routine medication management follow up.    Antonio Lewis is a 27 y.o. male with a past psychiatric history of ADHD who presents today for a psychiatric follow up appointment.    The patient was last seen on 06/11/22, at which time the following plan was established: Medication management:             - Start Adderall XR 10mg  daily for ADHD   _______________________________________________________________________________________ Acute events/encounters since last visit: denies    Antonio Lewis presents alone for his appointment. He reports that he noticed benefit after starting Adderall. He could tell he was more focused and organized on the medicine. He noticed it didn't give him energy like he thought it would, but did feel benefit. He feels that the benefit wore off some by the end of the month. He denies any major side effects to it. He is taking it as prescribed (works night shift) and has been doing his best to eat enough. He gained 3 lbs and is happy about this. Discussed trying a higher dose and is agreeable to this.   Still drinking an energy drink every day. Still uses THC socially but not habitually per him. He denies other substance use. No SI/HI/Avh.    Sleep: stable Appetite: Decreased Depression: denies Bipolar symptoms:  denies Current suicidal/homicidal ideations:  denied Current auditory/visual hallucinations:  denied   Suicide Attempt/Self-Harm History: denies  Psychotherapy: denies  Previous psychiatric medication trials:  Concerta, Vyvanse    Works for [employer] Situation:  lives with dad and step mom     Allergies  Allergen Reactions   Morphine And Related Itching    Pt states it made his arm and face burn      Labs:  reviewed  Medical diagnoses: Patient Active Problem List   Diagnosis Date  Noted   Encounter for drug screening 05/13/2022   Attention deficit 05/13/2022   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   Weight 150 lb (68 kg).Body mass index is 19.26 kg/m.  General Appearance: Neat and Well Groomed  Eye Contact:  Good  Speech:  Clear and Coherent and Normal Rate  Mood:  Anxious  Affect:  Appropriate  Thought Process:  Coherent and Goal Directed  Orientation:  Full (Time, Place, and Person)  Thought Content:  Logical  Suicidal Thoughts:  No  Homicidal Thoughts:  No  Memory:  Immediate;   Fair  Judgement:  Fair  Insight:  Fair  Psychomotor Activity:  Normal  Concentration:  Concentration: Fair  Recall:  Good  Fund of Knowledge:  Good  Language:  Good  Assets:  Communication Skills Desire for Improvement Financial Resources/Insurance Housing Leisure Time Physical Health Resilience Social Support Talents/Skills Transportation Vocational/Educational  Cognition:  WNL      Assessment   Psychiatric Diagnoses:   ICD-10-CM   1. Attention deficit hyperactivity disorder (ADHD), combined type  F90.2       Patient complexity: Moderate   Patient Education and Counseling:  Supportive therapy provided for identified psychosocial stressors.  Medication education provided and decisions regarding medication regimen discussed with patient/guardian.  On assessment today, Antonio Lewis seems to have responded well to his Adderall. He has been more organized and focused, and is now exercising and doing more productive things with his time. He still uses some THC and still drinks a fair amount of caffeine. His weight is up from his last visit which is reassuring. We will try a higher dose for now. No  SI/HI/AVH.   Plan  Medication management:  - Increase Adderall XR to 15mg  daily for ADHD  Labs/Studies:  - UDS negative aside from Cannabis  - Tsh wnl  - Will monitor weight (150lb now from 147lb)  Additional recommendations:  - Crisis plan reviewed and patient verbally contracts for safety. Go to ED with emergent symptoms or safety concerns and Risks, benefits, side effects of medications, including any / all black box warnings, discussed with patient, who verbalizes their understanding  - UDS negative for all but cannabis.  - Reduce caffeine use  - No further Kratom use  - Will monitor weight   Follow Up: Return in 1 month - Call in the interim for any side-effects, decompensation, questions, or problems between now and the next visit.   I have spent 25 minutes reviewing the patients chart, meeting with the patient and family, and reviewing medicines and side effects.   Acquanetta Belling, MD [facility]

## 2022-08-09 ENCOUNTER — Encounter: Payer: Self-pay | Admitting: Psychiatry

## 2022-08-09 ENCOUNTER — Ambulatory Visit (INDEPENDENT_AMBULATORY_CARE_PROVIDER_SITE_OTHER): Payer: 59 | Admitting: Psychiatry

## 2022-08-09 VITALS — Wt 147.0 lb

## 2022-08-09 DIAGNOSIS — F902 Attention-deficit hyperactivity disorder, combined type: Secondary | ICD-10-CM | POA: Diagnosis not present

## 2022-08-09 DIAGNOSIS — Z79899 Other long term (current) drug therapy: Secondary | ICD-10-CM | POA: Diagnosis not present

## 2022-08-09 MED ORDER — CYPROHEPTADINE HCL 4 MG PO TABS: 4.0000 mg | ORAL_TABLET | Freq: Two times a day (BID) | ORAL | 1 refills | Status: DC

## 2022-08-09 MED ORDER — AMPHETAMINE-DEXTROAMPHET ER 15 MG PO CP24: 15.0000 mg | ORAL_CAPSULE | Freq: Every day | ORAL | 0 refills | Status: DC

## 2022-08-09 NOTE — Progress Notes (Signed)
[facility] [address], [place]   Follow-up visit  Date of Service: 08/09/2022  CC/Purpose: Routine medication management follow up.    Antonio Lewis is a 27 y.o. male with a past psychiatric history of ADHD who presents today for a psychiatric follow up appointment.    The patient was last seen on 07/09/22, at which time the following plan was established:  Medication management:             - Increase Adderall XR to  daily for ADHD   _______________________________________________________________________________________ Acute events/encounters since last visit: denies    Antonio Lewis presents alone for his appointment. He states that he has been taking the higher dose of Adderall as prescribed. He so far feels it has been helping a lot more with organization and focus. He continues to avoid substances as able - some occasional Thc and some caffeine still. No other substances reported. He still struggles with his appetite some, he is trying to eat enough but sometimes just can't. Discussed adding an appetite stimulant - he is okay with trying one to see if it can improve his appetite and weight. No SI/HI/Avh.    Sleep: stable Appetite: Decreased Depression: denies Bipolar symptoms:  denies Current suicidal/homicidal ideations:  denied Current auditory/visual hallucinations:  denied   Suicide Attempt/Self-Harm History: denies  Psychotherapy: denies  Previous psychiatric medication trials:  Concerta, Vyvanse    Works for [employer] Situation:  lives with dad and step mom     Allergies  Allergen Reactions   Morphine And Related Itching    Pt states it made his arm and face burn      Labs:  reviewed  Medical diagnoses: Patient Active Problem List   Diagnosis Date Noted   Encounter for drug screening 05/13/2022   Attention deficit 05/13/2022   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   There were no vitals taken for this visit.There is no height or weight on file to calculate BMI.  General Appearance: Neat and Well Groomed  Eye Contact:  Good  Speech:  Clear and Coherent and Normal Rate  Mood:  Anxious  Affect:  Appropriate  Thought Process:  Coherent and Goal Directed  Orientation:  Full (Time, Place, and Person)  Thought Content:  Logical  Suicidal Thoughts:  No  Homicidal Thoughts:  No  Memory:  Immediate;   Fair  Judgement:  Fair  Insight:  Fair  Psychomotor Activity:  Normal  Concentration:  Concentration: Fair  Recall:  Good  Fund of Knowledge:  Good  Language:  Good  Assets:  Communication Skills Desire for Improvement Financial Resources/Insurance Housing Leisure Time Physical Health Resilience Social Support Talents/Skills Transportation Vocational/Educational  Cognition:  WNL      Assessment   Psychiatric Diagnoses:   ICD-10-CM   1. Attention deficit hyperactivity disorder (ADHD), combined type  F90.2     2. High risk medication use  Z79.899       Patient complexity: Moderate   Patient Education and Counseling:  Supportive therapy provided for identified psychosocial stressors.  Medication education provided and decisions regarding medication regimen discussed with patient/guardian.   On assessment today, Antonio Lewis seems to have responded well  to his Adderall. He is more organized on interview, less scattered. He is doing well in his personal life and at work. We will start a appetite stimulant given his low weight and low appetite. NO SI/HI/Avh.   Plan  Medication management:  - Continue Adderall XR 15mg  daily for ADHD  - Start cyproheptadine 4mg  BID for  appetite  Labs/Studies:  - UDS negative aside from Cannabis  - Tsh wnl  - Will monitor weight (150lb now from 147lb)  Additional recommendations:  - Crisis plan reviewed and patient verbally contracts for safety. Go to ED with emergent symptoms or safety concerns and Risks, benefits, side effects of medications, including any / all black box warnings, discussed with patient, who verbalizes their understanding  - UDS negative for all but cannabis.  - Reduce caffeine use  - No further Kratom use  - Will monitor weight   Follow Up: Return in 1 month - Call in the interim for any side-effects, decompensation, questions, or problems between now and the next visit.   I have spent 30 minutes reviewing the patients chart, meeting with the patient and family, and reviewing medicines and side effects.   Kendal Hymen, MD [facility]

## 2022-08-25 ENCOUNTER — Telehealth: Payer: Self-pay | Admitting: Psychiatry

## 2022-08-25 MED ORDER — CYPROHEPTADINE HCL 4 MG PO TABS: 4.0000 mg | ORAL_TABLET | Freq: Two times a day (BID) | ORAL | 1 refills | Status: DC

## 2022-08-25 NOTE — Telephone Encounter (Signed)
Sent!

## 2022-08-25 NOTE — Telephone Encounter (Signed)
Pt called at 9:45a.  The pharmacy was not able to get him the original script for cyproheptadine (PERIACTIN) 4 MG tablet  before he left on his way out of town.  He's back in town and he is asking to have it sen to [facility] [address] [place] now.    Next appt 5/9

## 2022-09-02 ENCOUNTER — Ambulatory Visit: Payer: 59 | Admitting: Psychiatry

## 2022-10-08 ENCOUNTER — Ambulatory Visit (INDEPENDENT_AMBULATORY_CARE_PROVIDER_SITE_OTHER): Payer: Self-pay | Admitting: Psychiatry

## 2022-10-08 DIAGNOSIS — F902 Attention-deficit hyperactivity disorder, combined type: Secondary | ICD-10-CM

## 2022-10-11 NOTE — Progress Notes (Signed)
No show

## 2023-01-24 ENCOUNTER — Other Ambulatory Visit: Payer: Self-pay

## 2023-01-24 ENCOUNTER — Emergency Department (HOSPITAL_COMMUNITY)
Admission: EM | Admit: 2023-01-24 | Discharge: 2023-01-25 | Disposition: A | Discharge status: Home / Self Care | Payer: Managed Care, Other (non HMO) | Attending: Emergency Medicine | Admitting: Emergency Medicine

## 2023-01-24 DIAGNOSIS — M545 Low back pain, unspecified: Secondary | ICD-10-CM | POA: Insufficient documentation

## 2023-01-24 DIAGNOSIS — Z7982 Long term (current) use of aspirin: Secondary | ICD-10-CM | POA: Diagnosis not present

## 2023-01-24 NOTE — ED Triage Notes (Signed)
Pt reports he pulled a muscle x 1 day ago, pt tried to work through pain , couldn't. Job is requesting doctor.

## 2023-01-25 MED ORDER — CYCLOBENZAPRINE HCL 10 MG PO TABS: 10.0000 mg | ORAL_TABLET | Freq: Two times a day (BID) | ORAL | 0 refills | Status: DC | PRN

## 2023-01-25 NOTE — ED Provider Notes (Signed)
[place] [facility] AT [facility] Provider Note   CSN: [id number] Arrival date & time: 01/24/23  2215     History  Chief Complaint  Patient presents with   Back Pain    Antonio Lewis is a 27 y.o. male.  HPI   Patient without significant medical history presented with complaints of back pain, states that back pain started about 2 days ago, happened while he was playing with his 20-year-old, states he was throwing them around that and felt a pinch in his right lower back.  Pain does not radiate, describes as a pinching like sensation, does not radiate down to his legs, no urinary or bowel incontinency, denies any urinary symptoms, pain is worse with movement improved with rest, been take over-the-counter pain medications.  Denies any history of connective tissue disorders like Murfin disease, no history of aneurysms dissections, states that he came in because he needs a work note.  Home Medications Prior to Admission medications   Medication Sig Start Date End Date Taking? Authorizing Provider  cyclobenzaprine (FLEXERIL) 10 MG tablet Take 1 tablet (10 mg total) by mouth 2 (two) times daily as needed for muscle spasms. 01/25/23  Yes Carroll Sage, PA-C  amphetamine-dextroamphetamine (ADDERALL XR) 15 MG 24 hr capsule Take 1 capsule by mouth daily. 08/09/22   Kendal Hymen, MD  aspirin EC 81 MG tablet Take 1 tablet (81 mg total) by mouth daily. 04/26/18   Rhyne, Ames Coupe, PA-C  cephALEXin (KEFLEX) 500 MG capsule Take 1 capsule (500 mg total) by mouth 2 (two) times daily. 04/26/18   Rhyne, Ames Coupe, PA-C  cyproheptadine (PERIACTIN) 4 MG tablet Take 1 tablet (4 mg total) by mouth 2 (two) times daily. 08/25/22   Kendal Hymen, MD  docusate sodium (COLACE) 100 MG capsule Take 100 mg by mouth daily.    [provider]  ibuprofen (ADVIL) 200 MG tablet Take 3 tablets (600 mg total) by mouth every 6 (six) hours. 05/01/18   Mack Hook, MD  Multiple Vitamin  (MULTIVITAMIN) tablet Take 1 tablet by mouth daily.    [provider]  oxyCODONE-acetaminophen (PERCOCET/ROXICET) 5-325 MG tablet Take 1-2 tablets by mouth every 4 (four) hours as needed for severe pain. 08/24/19   Margarita Grizzle, MD      Allergies    Morphine and codeine    Review of Systems   Review of Systems  Constitutional:  Negative for chills and fever.  Respiratory:  Negative for shortness of breath.   Cardiovascular:  Negative for chest pain.  Gastrointestinal:  Negative for abdominal pain.  Musculoskeletal:  Positive for back pain. Negative for neck pain.  Neurological:  Negative for headaches.    Physical Exam Updated Vital Signs BP 126/63 (BP Location: Right Arm)   Pulse (!) 50   Temp 97.6 F (36.4 C) (Oral)   Resp 18   SpO2 100%  Physical Exam Vitals and nursing note reviewed.  Constitutional:      General: He is not in acute distress.    Appearance: He is not ill-appearing.  HENT:     Head: Normocephalic and atraumatic.     Nose: No congestion.  Eyes:     Conjunctiva/sclera: Conjunctivae normal.  Cardiovascular:     Rate and Rhythm: Normal rate and regular rhythm.  Abdominal:     Palpations: Abdomen is soft.     Tenderness: There is no abdominal tenderness. There is no right CVA tenderness or left CVA tenderness.  Musculoskeletal:  Comments: Spine palpated was nontender to palpation no step-off deformities noted, patient has noted focalized tenderness within the musculature surrounding the right paraspinal region, mainly around lumbar region.  Pain is focalized and easily reproducible.  He has 5 out of 5 strength in the lower extremities.  Skin:    General: Skin is warm and dry.  Neurological:     Mental Status: He is alert.  Psychiatric:        Mood and Affect: Mood normal.     ED Results / Procedures / Treatments   Labs (all labs ordered are listed, but only abnormal results are displayed) Labs Reviewed - No data to  display  EKG None  Radiology No results found.  Procedures Procedures    Medications Ordered in ED Medications - No data to display  ED Course/ Medical Decision Making/ A&P                                 Medical Decision Making  This patient presents to the ED for concern of back pain, this involves an extensive number of treatment options, and is a complaint that carries with it a high risk of complications and morbidity.  The differential diagnosis includes AAA, dissection, aneurysm, cauda equina, UTI, Pilo    Additional history obtained:  Additional history obtained from N/A External records from outside source obtained and reviewed including behavioral health notes   Co morbidities that complicate the patient evaluation  N/A  Social Determinants of Health:  No PCP    Lab Tests:  I Ordered, and personally interpreted labs.  The pertinent results include: N/A   Imaging Studies ordered:  I ordered imaging studies including N/A I independently visualized and interpreted imaging which showed N/A I agree with the radiologist interpretation   Cardiac Monitoring:  The patient was maintained on a cardiac monitor.  I personally viewed and interpreted the cardiac monitored which showed an underlying rhythm of: N/A   Medicines ordered and prescription drug management:  I ordered medication including N/A I entered have reviewed the patients home medicines and have made adjustments as needed  Critical Interventions:  N/A   Reevaluation:  Patient had a benign physical exam, agreement with discharge at this time.  Consultations Obtained:  N/a    Test Considered:  Imaging of the lumbar spine-deferred suspicion for fracture is very low at this time no traumatic injury associated this injury, patient is not at an increased risk for pathological fractures.    Rule out I have low suspicion for spinal fracture or spinal cord abnormality as patient  denies urinary incontinency, retention, difficulty with bowel movements, denies saddle paresthesias.  Spine was palpated there is no step-off, crepitus or gross deformities felt, patient had 5/5 strength, full range of motion, neurovascular fully intact in the lower extremities.  Doubt AAA or dissection presentation atypical he has low risk factors pain is focalized and reproducible.  I doubt UTI Pilo or kidney stone not endorsing any urinary symptoms. Low suspicion for septic arthritis as patient denies IV drug use, skin exam was performed no erythematous, edema or warm joints noted.     Dispostion and problem list  After consideration of the diagnostic results and the patients response to treatment, I feel that the patent would benefit from discharge.  Back pain-likely muscular strain recommend over-the-counter pain medications, can provide with a muscle laxer, follow-up with the PCP as needed strict return precautions.  Final Clinical Impression(s) / ED Diagnoses Final diagnoses:  Acute right-sided low back pain without sciatica    Rx / DC Orders ED Discharge Orders          Ordered    cyclobenzaprine (FLEXERIL) 10 MG tablet  2 times daily PRN        01/25/23 0258              Carroll Sage, PA-C 01/25/23 0259    Gilda Crease, MD 01/25/23 [phone]

## 2023-01-25 NOTE — Discharge Instructions (Signed)
You have been seen here for back pain, I recommend taking over-the-counter pain medications like ibuprofen and/or Tylenol every 6 as needed.  Please follow dosage and on the back of bottle.  I also recommend applying heat to the area and stretching out the muscles as this will help decrease stiffness and pain.  I have given you a muscle relaxer this can make you drowsy do not consume alcohol or operate heavy machinery while taking this medications.  Follow-up with your PCP as needed  Come back to the emergency department if you develop chest pain, shortness of breath, severe abdominal pain, uncontrolled nausea, vomiting, diarrhea.

## 2023-02-13 ENCOUNTER — Ambulatory Visit (HOSPITAL_COMMUNITY)
Admission: EM | Admit: 2023-02-13 | Discharge: 2023-02-13 | Disposition: A | Discharge status: Home / Self Care | Payer: Managed Care, Other (non HMO) | Attending: Family Medicine | Admitting: Family Medicine

## 2023-02-13 ENCOUNTER — Encounter (HOSPITAL_COMMUNITY): Payer: Self-pay | Admitting: Emergency Medicine

## 2023-02-13 ENCOUNTER — Other Ambulatory Visit: Payer: Self-pay

## 2023-02-13 DIAGNOSIS — R110 Nausea: Secondary | ICD-10-CM | POA: Diagnosis not present

## 2023-02-13 DIAGNOSIS — R5383 Other fatigue: Secondary | ICD-10-CM | POA: Diagnosis not present

## 2023-02-13 DIAGNOSIS — G479 Sleep disorder, unspecified: Secondary | ICD-10-CM | POA: Diagnosis not present

## 2023-02-13 MED ORDER — ONDANSETRON 4 MG PO TBDP: 4.0000 mg | ORAL_TABLET | Freq: Three times a day (TID) | ORAL | 0 refills | Status: DC | PRN

## 2023-02-13 NOTE — ED Triage Notes (Signed)
Patient reports he is nauseated from lack of sleep.  And requesting a work note.    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  Patient had  a family emergency today and has not slept.

## 2023-02-13 NOTE — ED Provider Notes (Signed)
[facility]    CSN: [id number] Arrival date & time: 02/13/23  1719      History   Chief Complaint Chief Complaint  Patient presents with   Letter for School/Work    HPI Antonio Lewis is a 27 y.o. male.   Patient presents today with a 1 day history of extreme fatigue.  He reports that he had a family emergency and has been unable to sleep for the past 36 hours.  As report he is feeling very poorly and feels like he cannot keep his eyes open and is very tired.  He has also developed some nausea.  He denies any additional symptoms including abdominal pain, vomiting, fever, cough, congestion, chest pain, shortness of breath.  He has not tried any over-the-counter medications.  He has had similar episodes in the past when he has been sleep deprived.  This happens not infrequently because he works third shift.    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   Vasovagal syncope 08/09/2012   Dizziness and giddiness 08/09/2012   Depression 08/09/2012   Knee pain 04/14/2011   Anterior knee pain 03/30/2011    Past Surgical History:  Procedure Laterality Date   ARTERY REPAIR Left 04/25/2018   Procedure: LEFT WRIST EXPLORATION;  Surgeon: Maeola Harman, MD;  Location: [facility] OR;  Service: Vascular;  Laterality: Left;   ARTERY REPAIR Left 04/25/2018   Procedure: RADIAL ARTERY REPAIR;  Surgeon: Maeola Harman, MD;  Location: [facility] OR;  Service: Vascular;  Laterality: Left;   NERVE REPAIR Left 05/01/2018   Procedure: left forearm nerve repair;  Surgeon: Mack Hook, MD;  Location: [facility];  Service:  Orthopedics;  Laterality: Left;   ORIF ANKLE FRACTURE Right        Home Medications    Prior to Admission medications   Medication Sig Start Date End Date Taking? Authorizing Provider  ondansetron (ZOFRAN-ODT) 4 MG disintegrating tablet Take 1 tablet (4 mg total) by mouth every 8 (eight) hours as needed for nausea or vomiting. 02/13/23  Yes Zacchaeus Halm, Noberto Retort, PA-C  aspirin EC 81 MG tablet Take 1 tablet (81 mg total) by mouth daily. 04/26/18   Rhyne, Ames Coupe, PA-C  docusate sodium (COLACE) 100 MG capsule Take 100 mg by mouth daily.    [provider]  ibuprofen (ADVIL) 200 MG tablet Take 3 tablets (600 mg total) by mouth every 6 (six) hours. 05/01/18   Mack Hook, MD  Multiple Vitamin (MULTIVITAMIN) tablet Take 1 tablet by mouth daily.    [provider]    Family History Family History  Problem Relation Age of Onset   Sudden death Paternal Grandfather        At age 59   Headache Father    Sudden death Other        At age 64   Sudden death Paternal Uncle        At age 77    Social History Social History   Tobacco Use   Smoking status: Former    Types: Cigarettes   Smokeless tobacco: Never  Tobacco comments:    1 cig./day  Vaping Use   Vaping status: Some Days  Substance Use Topics   Alcohol use: Yes   Drug use: Yes    Types: Marijuana, Other-see comments    Comment: kratom, high amounts of caffeine     Allergies   Morphine and codeine   Review of Systems Review of Systems  Constitutional:  Positive for activity change and fatigue. Negative for appetite change and fever.  Respiratory:  Negative for cough and shortness of breath.   Cardiovascular:  Negative for chest pain.  Gastrointestinal:  Positive for nausea. Negative for abdominal pain, diarrhea and vomiting.     Physical Exam Triage Vital Signs ED Triage Vitals  Encounter Vitals Group     BP 02/13/23 1748 122/61     Systolic BP Percentile --      Diastolic BP Percentile --       Pulse Rate 02/13/23 1748 81     Resp 02/13/23 1748 18     Temp 02/13/23 1748 98.3 F (36.8 C)     Temp Source 02/13/23 1748 Oral     SpO2 02/13/23 1748 96 %     Weight --      Height --      Head Circumference --      Peak Flow --      Pain Score 02/13/23 1743 0     Pain Loc --      Pain Education --      Exclude from Growth Chart --    No data found.  Updated Vital Signs BP 122/61 (BP Location: Right Arm)   Pulse 81   Temp 98.3 F (36.8 C) (Oral)   Resp 18   SpO2 96%   Visual Acuity Right Eye Distance:   Left Eye Distance:   Bilateral Distance:    Right Eye Near:   Left Eye Near:    Bilateral Near:     Physical Exam Vitals reviewed.  Constitutional:      General: He is awake.     Appearance: Normal appearance. He is well-developed. He is not ill-appearing.     Comments: Very pleasant male appears stated age in no acute distress sitting comfortably in exam room  HENT:     Head: Normocephalic and atraumatic.     Mouth/Throat:     Pharynx: No oropharyngeal exudate, posterior oropharyngeal erythema or uvula swelling.  Cardiovascular:     Rate and Rhythm: Normal rate and regular rhythm.     Heart sounds: Normal heart sounds, S1 normal and S2 normal. No murmur heard. Pulmonary:     Effort: Pulmonary effort is normal.     Breath sounds: Normal breath sounds. No stridor. No wheezing, rhonchi or rales.     Comments: Clear auscultation bilaterally Abdominal:     General: Bowel sounds are normal.     Palpations: Abdomen is soft.     Tenderness: There is no abdominal tenderness. There is no right CVA tenderness, left CVA tenderness, guarding or rebound.     Comments: Benign abdominal exam  Neurological:     Mental Status: He is alert.  Psychiatric:        Behavior: Behavior is cooperative.      UC Treatments / Results  Labs (all labs ordered are listed, but only abnormal results are displayed) Labs Reviewed - No data to display  EKG   Radiology No  results found.  Procedures Procedures (including critical care time)  Medications Ordered in UC  Medications - No data to display  Initial Impression / Assessment and Plan / UC Course  I have reviewed the triage vital signs and the nursing notes.  Pertinent labs & imaging results that were available during my care of the patient were reviewed by me and considered in my medical decision making (see chart for details).     Patient is well-appearing, afebrile, nontoxic, nontachycardic.  Patient is well-appearing with no indication for emergent evaluation or imaging.  I suspect symptoms are related to sleep deprivation.  I did give him a prescription for Zofran to be used if his nausea does not improve after he has had rest.  Recommended that he push fluids and eat small frequent meals.  He is to go home and sleep and we discussed that if his symptoms are not improving or if anything worsens and he develops persistent nausea, vomiting, diarrhea, fever, abdominal pain, weakness he needs to return for reevaluation.  Strict return precautions given.  Work excuse note provided.  Final Clinical Impressions(s) / UC Diagnoses   Final diagnoses:  Fatigue, unspecified type  Sleep disturbance  Nausea without vomiting     Discharge Instructions      Go home and rest.  Pick up the Zofran from the pharmacy if your symptoms are not improving and use this every 8 hours as needed.  Eat a bland diet and drink plenty of fluid.  If you develop any additional symptoms including fever, abdominal pain, vomiting, weakness you need to be seen immediately.     ED Prescriptions     Medication Sig Dispense Auth. Provider   ondansetron (ZOFRAN-ODT) 4 MG disintegrating tablet Take 1 tablet (4 mg total) by mouth every 8 (eight) hours as needed for nausea or vomiting. 20 tablet Fontella Shan, Noberto Retort, PA-C      PDMP not reviewed this encounter.   Jeani Hawking, PA-C 02/13/23 1805

## 2023-02-13 NOTE — Discharge Instructions (Signed)
Go home and rest.  Pick up the Zofran from the pharmacy if your symptoms are not improving and use this every 8 hours as needed.  Eat a bland diet and drink plenty of fluid.  If you develop any additional symptoms including fever, abdominal pain, vomiting, weakness you need to be seen immediately.

## 2023-08-21 ENCOUNTER — Other Ambulatory Visit: Payer: Self-pay

## 2023-08-21 ENCOUNTER — Encounter (HOSPITAL_COMMUNITY): Payer: Self-pay

## 2023-08-21 ENCOUNTER — Emergency Department (HOSPITAL_COMMUNITY)
Admission: EM | Admit: 2023-08-21 | Discharge: 2023-08-21 | Disposition: A | Discharge status: Home / Self Care | Attending: Emergency Medicine | Admitting: Emergency Medicine

## 2023-08-21 DIAGNOSIS — Z0279 Encounter for issue of other medical certificate: Secondary | ICD-10-CM | POA: Insufficient documentation

## 2023-08-21 DIAGNOSIS — R110 Nausea: Secondary | ICD-10-CM | POA: Diagnosis present

## 2023-08-21 DIAGNOSIS — Z0000 Encounter for general adult medical examination without abnormal findings: Secondary | ICD-10-CM

## 2023-08-21 NOTE — ED Provider Notes (Signed)
  [place] EMERGENCY DEPARTMENT AT [facility] Provider Note   CSN: [id number] Arrival date & time: 08/21/23  1956     History  Chief Complaint  Patient presents with   Emesis    CONLEY DEMAN is a 28 y.o. male.  28 year old male here today primarily because he says he needs a note for work.  Patient says that he woke up today did not feel well.  He felt a little nauseated, little tired.  He says that he would have "handled this at home."  But his work requires him to have a note if he misses work.  Patient declines blood work   Emesis      Home Medications Prior to Admission medications   Medication Sig Start Date End Date Taking? Authorizing Provider  aspirin  EC 81 MG tablet Take 1 tablet (81 mg total) by mouth daily. 04/26/18   Rhyne, Samantha J, PA-C  docusate sodium  (COLACE) 100 MG capsule Take 100 mg by mouth daily.    [provider]  ibuprofen  (ADVIL ) 200 MG tablet Take 3 tablets (600 mg total) by mouth every 6 (six) hours. 05/01/18   Rober Chimera, MD  Multiple Vitamin (MULTIVITAMIN) tablet Take 1 tablet by mouth daily.    [provider]  ondansetron  (ZOFRAN -ODT) 4 MG disintegrating tablet Take 1 tablet (4 mg total) by mouth every 8 (eight) hours as needed for nausea or vomiting. 02/13/23   Raspet, Erin K, PA-C      Allergies    Morphine  and codeine    Review of Systems   Review of Systems  Gastrointestinal:  Positive for vomiting.    Physical Exam Updated Vital Signs BP (!) 140/82 (BP Location: Right Arm)   Pulse 77   Temp 99 F (37.2 C)   Resp 16   Ht 6\' 2"  (1.88 m)   Wt 70.3 kg   SpO2 97%   BMI 19.90 kg/m  Physical Exam Vitals reviewed.  Constitutional:      Appearance: He is not toxic-appearing.  Cardiovascular:     Rate and Rhythm: Normal rate.  Pulmonary:     Effort: Pulmonary effort is normal.  Abdominal:     Palpations: Abdomen is soft.  Skin:    General: Skin is warm.  Neurological:     Mental Status: He  is alert.     ED Results / Procedures / Treatments   Labs (all labs ordered are listed, but only abnormal results are displayed) Labs Reviewed  LIPASE, BLOOD  COMPREHENSIVE METABOLIC PANEL WITH GFR  CBC  URINALYSIS, ROUTINE W REFLEX MICROSCOPIC    EKG None  Radiology No results found.  Procedures Procedures    Medications Ordered in ED Medications - No data to display  ED Course/ Medical Decision Making/ A&P                                 Medical Decision Making 28 year old male here today for a general medical screening exam.  Plan-patient has normal vital signs.  He declines additional testing.  Will discharge.  Amount and/or Complexity of Data Reviewed Labs: ordered.           Final Clinical Impression(s) / ED Diagnoses Final diagnoses:  General medical exam    Rx / DC Orders ED Discharge Orders     None         Nathanael Baker, DO 08/21/23 2044

## 2023-08-21 NOTE — ED Triage Notes (Signed)
 Pt was vomiting this am until 12 pm and now he just does not feel good. Antonio Lewis he is supposed to be at work now but does not feel like it.

## 2023-08-21 NOTE — ED Notes (Signed)
 Pt refused blood draw and doesn't want to provide urin sample at this time.  KM

## 2023-09-12 ENCOUNTER — Emergency Department (HOSPITAL_COMMUNITY)
Admission: EM | Admit: 2023-09-12 | Discharge: 2023-09-12 | Disposition: A | Discharge status: Home / Self Care | Attending: Emergency Medicine | Admitting: Emergency Medicine

## 2023-09-12 ENCOUNTER — Encounter (HOSPITAL_COMMUNITY): Payer: Self-pay | Admitting: Emergency Medicine

## 2023-09-12 DIAGNOSIS — R112 Nausea with vomiting, unspecified: Secondary | ICD-10-CM | POA: Insufficient documentation

## 2023-09-12 DIAGNOSIS — R1111 Vomiting without nausea: Secondary | ICD-10-CM

## 2023-09-12 DIAGNOSIS — Z7982 Long term (current) use of aspirin: Secondary | ICD-10-CM | POA: Insufficient documentation

## 2023-09-12 NOTE — ED Provider Notes (Signed)
 [place] EMERGENCY DEPARTMENT AT [facility] Provider Note   CSN: [id number] Arrival date & time: 09/12/23  2138     History  Chief Complaint  Patient presents with   Letter for School/Work    Antonio Lewis is a 28 y.o. male.  Patient presents to the emergency room complaining of 1 episode of nausea and vomiting.  Patient states he left work this evening after drinking water too quickly.  He states he vomited 1 time and told his supervisor that he needed to go home.  The supervisor said he would need a note before returning to work.  The patient is asymptomatic at this time with no complaints.  HPI     Home Medications Prior to Admission medications   Medication Sig Start Date End Date Taking? Authorizing Provider  aspirin  EC 81 MG tablet Take 1 tablet (81 mg total) by mouth daily. 04/26/18   Rhyne, Samantha J, PA-C  docusate sodium  (COLACE) 100 MG capsule Take 100 mg by mouth daily.    [provider]  ibuprofen  (ADVIL ) 200 MG tablet Take 3 tablets (600 mg total) by mouth every 6 (six) hours. 05/01/18   Rober Chimera, MD  Multiple Vitamin (MULTIVITAMIN) tablet Take 1 tablet by mouth daily.    [provider]  ondansetron  (ZOFRAN -ODT) 4 MG disintegrating tablet Take 1 tablet (4 mg total) by mouth every 8 (eight) hours as needed for nausea or vomiting. 02/13/23   Raspet, Erin K, PA-C      Allergies    Morphine  and codeine    Review of Systems   Review of Systems  Physical Exam Updated Vital Signs BP (!) 145/80 (BP Location: Left Arm)   Pulse (!) 49   Temp 98.8 F (37.1 C) (Oral)   Resp 14   SpO2 100%  Physical Exam HENT:     Head: Normocephalic and atraumatic.  Eyes:     Pupils: Pupils are equal, round, and reactive to light.  Pulmonary:     Effort: Pulmonary effort is normal. No respiratory distress.  Musculoskeletal:        General: No signs of injury.     Cervical back: Normal range of motion.  Skin:    General: Skin is dry.   Neurological:     Mental Status: He is alert.  Psychiatric:        Speech: Speech normal.        Behavior: Behavior normal.     ED Results / Procedures / Treatments   Labs (all labs ordered are listed, but only abnormal results are displayed) Labs Reviewed - No data to display  EKG None  Radiology No results found.  Procedures Procedures    Medications Ordered in ED Medications - No data to display  ED Course/ Medical Decision Making/ A&P                                 Medical Decision Making  Patient presents the emergency room with a chief complaint of needing a work note.  No indication for emergent workup at this time.  Patient is asymptomatic.  Patient cleared to return to work tomorrow.        Final Clinical Impression(s) / ED Diagnoses Final diagnoses:  Vomiting without nausea, unspecified vomiting type    Rx / DC Orders ED Discharge Orders     None         Abelardo Hoehn  B, PA-C 09/12/23 2242    Rolinda Climes, DO 09/13/23 0007

## 2023-09-12 NOTE — ED Triage Notes (Signed)
 PT was at work and drank too much water too quickly. He vomited. He stated he needed to leave work and they told him he would need a note. He declined wanting meds for nausea. Completely WNL.

## 2023-09-12 NOTE — Discharge Instructions (Signed)
 You were cleared to return to work tomorrow.

## 2024-03-04 ENCOUNTER — Ambulatory Visit (HOSPITAL_COMMUNITY): Admission: EM | Admit: 2024-03-04 | Discharge: 2024-03-04 | Disposition: A | Discharge status: Home / Self Care

## 2024-03-04 ENCOUNTER — Encounter (HOSPITAL_COMMUNITY): Payer: Self-pay | Admitting: Emergency Medicine

## 2024-03-04 DIAGNOSIS — R5383 Other fatigue: Secondary | ICD-10-CM | POA: Diagnosis not present

## 2024-03-04 DIAGNOSIS — Z0289 Encounter for other administrative examinations: Secondary | ICD-10-CM

## 2024-03-04 NOTE — ED Triage Notes (Signed)
 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

## 2024-03-04 NOTE — ED Provider Notes (Signed)
 [facility]    CSN: [id number] Arrival date & time: 03/04/24  1912      History   Chief Complaint Chief Complaint  Patient presents with   Letter for School/Work    HPI Antonio Lewis is a 28 y.o. male.   Antonio Lewis is a 28 y.o. male presenting for chief complaint of Letter for School/Work.  He states he worked overnight last night and was not able to sleep much during the day today to prepare for his overnight shift tonight.  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  He is without other complaint.     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   Vasovagal syncope 08/09/2012   Dizziness and giddiness 08/09/2012   Depression 08/09/2012   Knee pain 04/14/2011   Anterior knee pain 03/30/2011    Past Surgical History:  Procedure Laterality Date   ARTERY REPAIR Left 04/25/2018   Procedure: LEFT WRIST EXPLORATION;  Surgeon: Sheree Penne Bruckner, MD;  Location: [facility] OR;  Service: Vascular;  Laterality: Left;   ARTERY REPAIR Left 04/25/2018   Procedure: RADIAL ARTERY REPAIR;  Surgeon: Sheree Penne Bruckner, MD;  Location: [facility] OR;  Service: Vascular;  Laterality: Left;   NERVE REPAIR Left 05/01/2018   Procedure: left forearm nerve repair;  Surgeon: Sebastian Lenis, MD;  Location: [facility];  Service: Orthopedics;  Laterality: Left;   ORIF ANKLE FRACTURE Right        Home Medications    Prior to Admission medications   Not on File    Family  History Family History  Problem Relation Age of Onset   Sudden death Paternal Grandfather        At age 71   Headache Father    Sudden death Other        At age 65   Sudden death Paternal Uncle        At age 9    Social History Social History   Tobacco Use   Smoking status: Former    Types: Cigarettes   Smokeless tobacco: Never   Tobacco comments:    1 cig./day  Vaping Use   Vaping status: Some Days  Substance Use Topics   Alcohol use: Yes   Drug use: Yes    Types: Marijuana, Other-see comments    Comment: kratom, high amounts of caffeine     Allergies   Morphine  and codeine   Review of Systems Review of Systems Per HPI  Physical Exam Triage Vital Signs ED Triage Vitals  Encounter Vitals Group     BP 03/04/24 1928 (!) 115/49     Girls Systolic BP Percentile --      Girls Diastolic BP Percentile --      Boys Systolic BP Percentile --      Boys Diastolic BP Percentile --      Pulse Rate 03/04/24 1928 60  Resp 03/04/24 1928 15     Temp 03/04/24 1928 98.1 F (36.7 C)     Temp Source 03/04/24 1928 Oral     SpO2 03/04/24 1928 97 %     Weight --      Height --      Head Circumference --      Peak Flow --      Pain Score 03/04/24 1927 0     Pain Loc --      Pain Education --      Exclude from Growth Chart --    No data found.  Updated Vital Signs BP (!) 115/49 (BP Location: Left Arm)   Pulse 60   Temp 98.1 F (36.7 C) (Oral)   Resp 15   SpO2 97%   Visual Acuity Right Eye Distance:   Left Eye Distance:   Bilateral Distance:    Right Eye Near:   Left Eye Near:    Bilateral Near:     Physical Exam Vitals and nursing note reviewed.  Constitutional:      Appearance: He is not ill-appearing or toxic-appearing.  HENT:     Head: Normocephalic and atraumatic.     Right Ear: Hearing and external ear normal.     Left Ear: Hearing and external ear normal.     Nose: Nose normal.     Mouth/Throat:     Lips: Pink.  Eyes:     General: Lids are  normal. Vision grossly intact. Gaze aligned appropriately.     Extraocular Movements: Extraocular movements intact.     Conjunctiva/sclera: Conjunctivae normal.  Pulmonary:     Effort: Pulmonary effort is normal.  Musculoskeletal:     Cervical back: Neck supple.  Skin:    General: Skin is warm and dry.     Capillary Refill: Capillary refill takes less than 2 seconds.     Findings: No rash.  Neurological:     General: No focal deficit present.     Mental Status: He is alert and oriented to person, place, and time. Mental status is at baseline.     Cranial Nerves: No dysarthria or facial asymmetry.  Psychiatric:        Mood and Affect: Mood normal.        Speech: Speech normal.        Behavior: Behavior normal.        Thought Content: Thought content normal.        Judgment: Judgment normal.      UC Treatments / Results  Labs (all labs ordered are listed, but only abnormal results are displayed) Labs Reviewed - No data to display  EKG   Radiology No results found.  Procedures Procedures (including critical care time)  Medications Ordered in UC Medications - No data to display  Initial Impression / Assessment and Plan / UC Course  I have reviewed the triage vital signs and the nursing notes.  Pertinent labs & imaging results that were available during my care of the patient were reviewed by me and considered in my medical decision making (see chart for details).   1.  Encounter to obtain excuse from work, other fatigue Work note given.  Patient without other complaint.  Advised to avoid working with heavy machinery if he feels unsafe to do so.  Follow-up with PCP as needed.  Counseled patient on potential for adverse effects with medications prescribed/recommended today, strict ER and return-to-clinic precautions discussed, patient verbalized understanding.    Final Clinical Impressions(s) /  UC Diagnoses   Final diagnoses:  Encounter to obtain excuse from work   Other fatigue   Discharge Instructions   None    ED Prescriptions   None    PDMP not reviewed this encounter.   Enedelia Dorna HERO, [place] 03/04/24 [phone]

## 2024-04-13 ENCOUNTER — Encounter (HOSPITAL_COMMUNITY): Payer: Self-pay

## 2024-04-13 ENCOUNTER — Ambulatory Visit (HOSPITAL_COMMUNITY): Admission: EM | Admit: 2024-04-13 | Discharge: 2024-04-13 | Disposition: A | Discharge status: Home / Self Care

## 2024-04-13 DIAGNOSIS — R5383 Other fatigue: Secondary | ICD-10-CM | POA: Diagnosis not present

## 2024-04-13 DIAGNOSIS — Z0289 Encounter for other administrative examinations: Secondary | ICD-10-CM

## 2024-04-13 NOTE — Discharge Instructions (Signed)
 Make sure you are getting plenty of rest and staying hydrated. Your work note is attached to the doctor paperwork.

## 2024-04-13 NOTE — ED Triage Notes (Signed)
 Patient states he is needing a work note.

## 2024-04-13 NOTE — ED Provider Notes (Addendum)
 " [facility]    CSN: [id number] Arrival date & time: 04/13/24  1745      History   Chief Complaint No chief complaint on file.   HPI Antonio Lewis is a 28 y.o. male.   Patient presents requesting a work note.  Patient reports that he has just been very tired lately and would like a day off.  Patient denies any other symptoms or concerns at this time.  Patient would like a note to return to work on 12/21.  The history is provided by the patient and medical records.    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   Vasovagal syncope 08/09/2012   Dizziness and giddiness 08/09/2012   Depression 08/09/2012   Knee pain 04/14/2011   Anterior knee pain 03/30/2011    Past Surgical History:  Procedure Laterality Date   ARTERY REPAIR Left 04/25/2018   Procedure: LEFT WRIST EXPLORATION;  Surgeon: Sheree Penne Bruckner, MD;  Location: [facility] OR;  Service: Vascular;  Laterality: Left;   ARTERY REPAIR Left 04/25/2018   Procedure: RADIAL ARTERY REPAIR;  Surgeon: Sheree Penne Bruckner, MD;  Location: [facility] OR;  Service: Vascular;  Laterality: Left;   NERVE REPAIR Left 05/01/2018   Procedure: left forearm nerve repair;  Surgeon: Sebastian Lenis, MD;  Location: [facility];  Service: Orthopedics;  Laterality: Left;   ORIF ANKLE FRACTURE Right        Home Medications    Prior to Admission medications  Not on File    Family History Family History  Problem Relation Age of Onset   Sudden death Paternal Grandfather        At age 78   Headache Father    Sudden death Other        At age 27   Sudden death Paternal  Uncle        At age 50    Social History Social History[1]   Allergies   Morphine  and codeine   Review of Systems Review of Systems  Per HPI  Physical Exam Triage Vital Signs ED Triage Vitals  Encounter Vitals Group     BP 04/13/24 1942 126/64     Girls Systolic BP Percentile --      Girls Diastolic BP Percentile --      Boys Systolic BP Percentile --      Boys Diastolic BP Percentile --      Pulse Rate 04/13/24 1942 (!) 51     Resp 04/13/24 1942 16     Temp 04/13/24 1942 97.9 F (36.6 C)     Temp Source 04/13/24 1942 Oral     SpO2 04/13/24 1942 97 %     Weight --      Height --      Head Circumference --      Peak Flow --      Pain Score 04/13/24 1944 0     Pain Loc --      Pain Education --      Exclude from Growth Chart --    No data found.  Updated Vital  Signs BP 126/64 (BP Location: Left Arm)   Pulse (!) 51   Temp 97.9 F (36.6 C) (Oral)   Resp 16   SpO2 97%   Visual Acuity Right Eye Distance:   Left Eye Distance:   Bilateral Distance:    Right Eye Near:   Left Eye Near:    Bilateral Near:     Physical Exam Vitals and nursing note reviewed.  Constitutional:      General: He is awake. He is not in acute distress.    Appearance: Normal appearance. He is well-developed and well-groomed. He is not ill-appearing.  Skin:    General: Skin is warm and dry.  Neurological:     General: No focal deficit present.     Mental Status: He is alert and oriented to person, place, and time. Mental status is at baseline.  Psychiatric:        Behavior: Behavior is cooperative.      UC Treatments / Results  Labs (all labs ordered are listed, but only abnormal results are displayed) Labs Reviewed - No data to display  EKG   Radiology No results found.  Procedures Procedures (including critical care time)  Medications Ordered in UC Medications - No data to display  Initial Impression / Assessment and Plan / UC Course  I have reviewed the  triage vital signs and the nursing notes.  Pertinent labs & imaging results that were available during my care of the patient were reviewed by me and considered in my medical decision making (see chart for details).     Patient is overall well-appearing.  Vitals are stable.  Provided patient with work note as requested.  Discussed follow-up and return precautions. Final Clinical Impressions(s) / UC Diagnoses   Final diagnoses:  Other fatigue  Encounter to obtain excuse from work     Discharge Instructions      Make sure you are getting plenty of rest and staying hydrated. Your work note is attached to the doctor paperwork.    ED Prescriptions   None    PDMP not reviewed this encounter.    Johnie Flaming A, NP 04/13/24 2005     [1]  Social History Tobacco Use   Smoking status: Former    Types: Cigarettes   Smokeless tobacco: Never   Tobacco comments:    1 cig./day  Vaping Use   Vaping status: Some Days  Substance Use Topics   Alcohol use: Yes   Drug use: Yes    Types: Marijuana, Other-see comments    Comment: kratom, high amounts of caffeine     Johnie Flaming LABOR, NP 04/13/24 2006  "
